# Patient Record
Sex: Female | Born: 1980 | Race: Black or African American | Hispanic: No | Marital: Married | Smoking: Never smoker
Health system: Southern US, Community
[De-identification: ages and names within clinical notes are randomized; demographics above are authoritative.]

## PROBLEM LIST (undated history)

## (undated) ENCOUNTER — Inpatient Hospital Stay (HOSPITAL_COMMUNITY): Payer: Self-pay

## (undated) DIAGNOSIS — Z789 Other specified health status: Secondary | ICD-10-CM

## (undated) HISTORY — PX: TOE SURGERY: SHX1073

---
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
## 2007-11-03 ENCOUNTER — Encounter (INDEPENDENT_AMBULATORY_CARE_PROVIDER_SITE_OTHER): Payer: Self-pay | Admitting: Obstetrics and Gynecology

## 2007-11-03 ENCOUNTER — Inpatient Hospital Stay (HOSPITAL_COMMUNITY): Admission: AD | Admit: 2007-11-03 | Discharge: 2007-11-05 | Payer: Self-pay | Admitting: Obstetrics & Gynecology

## 2007-11-07 ENCOUNTER — Encounter: Admission: RE | Admit: 2007-11-07 | Discharge: 2007-12-07 | Payer: Self-pay | Admitting: Obstetrics and Gynecology

## 2007-12-08 ENCOUNTER — Encounter: Admission: RE | Admit: 2007-12-08 | Discharge: 2008-01-04 | Payer: Self-pay | Admitting: Obstetrics and Gynecology

## 2008-01-05 ENCOUNTER — Encounter: Admission: RE | Admit: 2008-01-05 | Discharge: 2008-01-25 | Payer: Self-pay | Admitting: Obstetrics and Gynecology

## 2011-02-23 NOTE — Op Note (Signed)
NAMEANELA, BENSMAN              ACCOUNT NO.:  [id number]   MEDICAL RECORD NO.:  [id number]          PATIENT TYPE:  INP   LOCATION:  9167                          FACILITY:  [facility]   PHYSICIAN:  Randye Lobo, M.D.   DATE OF BIRTH:  18-Aug-1981   DATE OF PROCEDURE:  11/03/2007  DATE OF DISCHARGE:                               OPERATIVE REPORT   SURGEON:  Randye Lobo, M.D.   PROCEDURE:  Normal spontaneous vaginal delivery with manual extraction  of placenta and endometrial curettage.   ANESTHESIA:  Epidural.   URINE OUTPUT:  75 mL prior to procedure.   ESTIMATED BLOOD LOSS:  400 mL.   COMPLICATIONS:  None.   INDICATIONS FOR PROCEDURE:  The patient is a 30 year old gravida 1, para  0 African American female admitted at 68 +1 weeks' gestation on November 03, 2007, who was noted to have contractions and cervical dilation.  The  patient was admitted in labor after having an uncomplicated prenatal  course.  The patient underwent artificial rupture of membranes for  augmentation of her labor when she was 6-7 cm dilated.  The patient did  receive an epidural for anesthesia.  The patient did have symptomatic  hypotension and she did receive ephedrine 5 mg IV after the epidural had  been placed.  The patient progressed throughout a normal labor course.  The patient then became complete in her dilation and began pushing.   FINDINGS:  A viable female was delivered at 12:30 with Apgars of 9 at one  minute and 9 at five minutes.  The weight was 2275 grams.  There was a  loose nuchal cord x1 which was reduced.  The umbilical cord was noted to  be thin, and the placenta was adherent to the uterine fundus and the  anterior uterine wall.   SPECIMENS:  The umbilical cord and placenta were sent to pathology.   PROCEDURE:  I was called to the bedside due to deep fetal variable  decelerations.  The patient was completely dilated at this time and the  vertex was noted to be at the 2+ station in  the left occiput anterior  position.  The patient's Foley catheter had been previously removed.  The patient was draped and proceeded to have a spontaneous vaginal  delivery of a viable newborn female.  The nuchal cord x1 was reduced.  The  newborn was placed on the maternal abdomen and he was noted to be in  vigorous condition.  The cord was doubly clamped and cut, and the  newborn was carried over to the baby warmer for routine examination and  medications.   The patient was examined was noted to have a second-degree laceration  and bilateral vulvar lacerations.  These were repaired in standard  fashion with 2-0 Vicryl.  The placenta showed some signs of separation  as the patient had two episodes of vaginal bleeding.  Very gentle  traction was applied on the umbilical cord for examination.  The  umbilical cord separated from the placenta with very minimal supportive  traction.  A manual extraction of the  placenta was then performed.  The  placenta was noted to be adherent to the anterior abdominal wall.   At this time, I was concerned that there were retained products of  conception and I did consent to the patient for a curettage of the  endometrium at bedside.  The patient did agree to the procedure after  risks, benefits, and alternatives were reviewed.  The patient had been  receiving penicillin for group B strep prophylaxis throughout her labor.  She had excellent anesthesia from her epidural.  The patient was  sterilely prepped and draped.  The bladder was catheterized of urine.  A  ring forceps was placed on the anterior cervical lip and a banjo curette  was inserted through the cervix and to the level of the uterine fundus  and the endometrium was gently curetted.  A small amount of additional  products of conception were obtained.  Again, manual exploration was  performed and additional small pieces were removed of the placenta.  Final curettage and then final exam  demonstrated no remaining products  of conception.   This concluded the patient's procedure.  She had no significant bleeding  during the curettage.  The patient did receive then Pitocin 20 units IV.   This concluded the patient's procedure.  All needle, sponge, and  instrument counts were correct.   The patient will be treated with antibiotic prophylaxis postpartum.      Randye Lobo, M.D.  Electronically Signed     BES/MEDQ  D:  11/03/2007  T:  11/03/2007  Job:  [id number]

## 2011-07-02 LAB — RPR: RPR Ser Ql: NONREACTIVE

## 2011-07-02 LAB — CBC
HCT: 33 — ABNORMAL LOW
HCT: 38.4
Hemoglobin: 10.7 — ABNORMAL LOW
MCHC: 33.7
MCHC: 34
MCHC: 34.7
MCV: 88.2
Platelets: 168
Platelets: 180
RBC: 4.35
RDW: 13.5
RDW: 13.9

## 2012-04-04 ENCOUNTER — Ambulatory Visit (INDEPENDENT_AMBULATORY_CARE_PROVIDER_SITE_OTHER): Payer: Managed Care, Other (non HMO) | Admitting: Family Medicine

## 2012-04-04 ENCOUNTER — Telehealth: Payer: Self-pay | Admitting: Family Medicine

## 2012-04-04 ENCOUNTER — Telehealth: Payer: Self-pay | Admitting: *Deleted

## 2012-04-04 VITALS — BP 115/68 | HR 75 | Temp 98.6°F | Resp 16 | Ht 68.0 in | Wt 204.6 lb

## 2012-04-04 DIAGNOSIS — Z23 Encounter for immunization: Secondary | ICD-10-CM

## 2012-04-04 DIAGNOSIS — Z9229 Personal history of other drug therapy: Secondary | ICD-10-CM

## 2012-04-04 MED ORDER — HEPATITIS B VAC RECOMBINANT 5 MCG/0.5ML IJ SUSP
0.5000 mL | Freq: Once | INTRAMUSCULAR | Status: AC
  Administered 2012-04-04: 5 ug via INTRAMUSCULAR

## 2012-04-04 MED ORDER — TETANUS-DIPHTH-ACELL PERTUSSIS 5-2.5-18.5 LF-MCG/0.5 IM SUSP
0.5000 mL | Freq: Once | INTRAMUSCULAR | Status: AC
  Administered 2012-04-04: 0.5 mL via INTRAMUSCULAR

## 2012-04-04 NOTE — Telephone Encounter (Signed)
Pt CB and advised to RTC for varicella lab. Pt agreed to come in later today.

## 2012-04-04 NOTE — Progress Notes (Signed)
    Patient Name: Taylor May Date of Birth: [birth year] Medical Record Number: [id number] Gender: female Date of Encounter: 04/04/2012  History of Present Illness:  [name] is a 31 y.o. 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  She needs to have a form completed, but does not wish to have a Pap, BW, etc today.  She is quite healthy and has no medical problems that she knows of.  LMP 03/15/12.  She had a negative PPD in April- she states that she was not told about a 2 step test, so is not sure if she needs this.  For now we will forgo a PPD, but she may certainly return for a PPD if needed.   There is no problem list on file for this patient.  No past medical history on file. No past surgical history on file. History  Substance Use Topics  . Smoking status: Never Smoker   . Smokeless tobacco: Not on file  . Alcohol Use: Not on file   No family history on file. Allergies  Allergen Reactions  . Codeine Hives and Other (See Comments)    hallucinations    Medication list has been reviewed and updated.  Prior to Admission medications   Not on File    Review of Systems:  As per HPI- otherwise negative.   Physical Examination: Filed Vitals:   04/04/12 0801  BP: 115/68  Pulse: 75  Temp: 98.6 F (37 C)  Resp: 16   Filed Vitals:   04/04/12 0801  Height: 5\' 8"  (1.727 m)  Weight: 204 lb 9.6 oz (92.806 kg)   Body mass index is 31.11 kg/(m^2). Ideal Body Weight: Weight in (lb) to have BMI = 25: 164.1   GEN: WDWN, NAD, Non-toxic, A & O x 3 HEENT: Atraumatic, Normocephalic. Neck supple. No masses, No LAD.  TM and oropharynx wnl, PEERL, EOMI Ears and Nose: No external deformity. CV: RRR, No M/G/R. No JVD. No thrill. No extra heart sounds. PULM: CTA B, no wheezes, crackles, rhonchi. No retractions. No resp. distress. No accessory muscle use. ABD: S,  NT, ND, +BS. No rebound. No HSM. EXTR: No c/c/e NEURO Normal gait.  PSYCH: Normally interactive. Conversant. Not depressed or anxious appearing.  Calm demeanor.    Assessment and Plan: 1. Immunizations up to date  Flu vaccine greater than or equal to 3yo preservative free IM, TDaP (BOOSTRIX) injection 0.5 mL, HEP B VACCINE 19 YRS + OLDER, HEP B VACCINE 19 YRS + OLDER   Completed physical exam forms for [facility] today, also started Hep B series, gave TDaP and flu shots, do varicella titer.  We mistakenly sent Delorse out without her blood draw- called her and asked her to please come back and get her varicella titer drawn.   Abbe Amsterdam, MD

## 2012-04-04 NOTE — Telephone Encounter (Signed)
Per Dr. Patsy Lager, called patient told patient to return to office for Varicella Titer test only.  Patient not to be charged a co-pay.  Patient will only be charged for lab and handling fee.  Angie Hawraa Stambaugh, CMA.

## 2012-04-04 NOTE — Telephone Encounter (Signed)
Please get patient to return today to get Varicella titer drawn she left without it being done tried to call her twice even left message on patient voicemail

## 2012-04-05 ENCOUNTER — Encounter: Payer: Self-pay | Admitting: Family Medicine

## 2012-05-12 ENCOUNTER — Encounter: Payer: Self-pay | Admitting: Family Medicine

## 2012-06-13 LAB — OB RESULTS CONSOLE HIV ANTIBODY (ROUTINE TESTING): HIV: NONREACTIVE

## 2012-06-13 LAB — OB RESULTS CONSOLE RPR: RPR: NONREACTIVE

## 2012-06-13 LAB — OB RESULTS CONSOLE GC/CHLAMYDIA
Chlamydia: NEGATIVE
Gonorrhea: NEGATIVE

## 2012-06-13 LAB — OB RESULTS CONSOLE ABO/RH: RH Type: POSITIVE

## 2012-10-11 NOTE — L&D Delivery Note (Signed)
Patient was C/C/+1 and pushed for 8 minutes with epidural.   NSVD  female infant, Apgars 9/9, weight pending.   The patient had a tiny left vaginal wall mucosal tear bleeding so repaired with 3-0 vicryl. Fundus was firm. EBL was expected. Placenta was delivered intact. Vagina was clear.  Baby was vigorous to bedside.  Philip Aspen

## 2012-11-14 ENCOUNTER — Inpatient Hospital Stay (HOSPITAL_COMMUNITY)
Admission: AD | Admit: 2012-11-14 | Discharge: 2012-11-14 | Disposition: A | Discharge status: Home / Self Care | Payer: Managed Care, Other (non HMO) | Source: Ambulatory Visit | Attending: Obstetrics and Gynecology | Admitting: Obstetrics and Gynecology

## 2012-11-14 ENCOUNTER — Encounter (HOSPITAL_COMMUNITY): Payer: Self-pay

## 2012-11-14 DIAGNOSIS — O4700 False labor before 37 completed weeks of gestation, unspecified trimester: Secondary | ICD-10-CM | POA: Insufficient documentation

## 2012-11-14 DIAGNOSIS — O99891 Other specified diseases and conditions complicating pregnancy: Secondary | ICD-10-CM | POA: Insufficient documentation

## 2012-11-14 DIAGNOSIS — R109 Unspecified abdominal pain: Secondary | ICD-10-CM | POA: Insufficient documentation

## 2012-11-14 DIAGNOSIS — O479 False labor, unspecified: Secondary | ICD-10-CM

## 2012-11-14 HISTORY — DX: Other specified health status: Z78.9

## 2012-11-14 LAB — URINALYSIS, ROUTINE W REFLEX MICROSCOPIC
Bilirubin Urine: NEGATIVE
Glucose, UA: NEGATIVE mg/dL
Ketones, ur: 15 mg/dL — AB
Protein, ur: NEGATIVE mg/dL
Urobilinogen, UA: 1 mg/dL (ref 0.0–1.0)

## 2012-11-14 LAB — URINE MICROSCOPIC-ADD ON

## 2012-11-14 MED ORDER — NIFEDIPINE 10 MG PO CAPS
10.0000 mg | ORAL_CAPSULE | Freq: Once | ORAL | Status: AC
  Administered 2012-11-14: 10 mg via ORAL
  Filled 2012-11-14: qty 1

## 2012-11-14 NOTE — Progress Notes (Signed)
Written and verbal d/c instructions given and understanding voiced. 

## 2012-11-14 NOTE — MAU Provider Note (Signed)
History     CSN: [id number]  Arrival date and time: 11/14/12 1238   None     Chief Complaint  Patient presents with  . Abdominal Pain   HPI Taylor May is a 32 y.o. female who presents to MAU with abdominal pain. The pain is located across the lower abdomen.  The pain started today approximately 9 am.  She describes the pain as cramping, pressure.  She rates the pain as 7/10. The history was provided by the patient.  OB History    Grav Para Term Preterm Abortions TAB SAB Ect Mult Living   2 1 1  0 0 0 0 0 0 1      Past Medical History  Diagnosis Date  . No pertinent past medical history     Past Surgical History  Procedure Date  . Ganglion cyst excision 1999    R wrist    Family History  Problem Relation Age of Onset  . Other Neg Hx     History  Substance Use Topics  . Smoking status: Never Smoker   . Smokeless tobacco: Not on file  . Alcohol Use: No    Allergies:  Allergies  Allergen Reactions  . Codeine Hives and Other (See Comments)    hallucinations    Prescriptions prior to admission  Medication Sig Dispense Refill  . calcium carbonate (OS-CAL) 1250 MG chewable tablet Chew 2 tablets by mouth daily.      Marland Kitchen dextromethorphan-guaiFENesin (MUCINEX DM) 30-600 MG per 12 hr tablet Take 1 tablet by mouth every 12 (twelve) hours.      . Dextromethorphan-Guaifenesin 5-100 MG/5ML LIQD Take 10 mLs by mouth.      . Prenatal Vit-Fe Fumarate-FA (PRENATAL MULTIVITAMIN) TABS Take 1 tablet by mouth daily.        ROS Blood pressure 125/68, pulse 82, temperature 97.1 F (36.2 C), temperature source Oral, resp. rate 20, height 5\' 10"  (1.778 m), weight 217 lb (98.431 kg), last menstrual period 03/15/2012.  Physical Exam  Nursing note and vitals reviewed. Constitutional: She is oriented to person, place, and time. She appears well-developed and well-nourished. No distress.  HENT:  Head: Normocephalic and atraumatic.  Eyes: EOM are normal.  Neck: Neck supple.   Cardiovascular: Normal rate.   Respiratory: Effort normal. No respiratory distress.  GI:       Gravid consistent with dates  Genitourinary:       Dilation: 1 Effacement (%): 60 Cervical Position: Posterior Station: -2 Exam by:: Quintella Baton RNC   Musculoskeletal: Normal range of motion.  Neurological: She is alert and oriented to person, place, and time.  Skin: Skin is warm and dry.  Psychiatric: She has a normal mood and affect. Her behavior is normal. Judgment and thought content normal.   Results for orders placed during the hospital encounter of 11/14/12 (from the past 24 hour(s))  URINALYSIS, ROUTINE W REFLEX MICROSCOPIC     Status: Abnormal   Collection Time   11/14/12 12:50 PM      Component Value Range   Color, Urine YELLOW  YELLOW   APPearance CLEAR  CLEAR   Specific Gravity, Urine 1.010  1.005 - 1.030   pH 6.5  5.0 - 8.0   Glucose, UA NEGATIVE  NEGATIVE mg/dL   Hgb urine dipstick TRACE (*) NEGATIVE   Bilirubin Urine NEGATIVE  NEGATIVE   Ketones, ur 15 (*) NEGATIVE mg/dL   Protein, ur NEGATIVE  NEGATIVE mg/dL   Urobilinogen, UA 1.0  0.0 - 1.0 mg/dL  Nitrite NEGATIVE  NEGATIVE   Leukocytes, UA NEGATIVE  NEGATIVE  URINE MICROSCOPIC-ADD ON     Status: Abnormal   Collection Time   11/14/12 12:50 PM      Component Value Range   Squamous Epithelial / LPF RARE  RARE   WBC, UA 0-2  <3 WBC/hpf   RBC / HPF 3-6  <3 RBC/hpf   Bacteria, UA FEW (*) RARE    EFM: baseline 135, reactive tracing contracting every 3 to 4 minutes Procedures Laura-Lee Villegas, RN, FNP, [facility] 11/14/2012, 1:46 PM  14:30 Discussed clinical and lab findings with Dr. Henderson Cloud. Will give Procardia   Assessment: 32 y.o. female @ [redacted]w[redacted]d gestation with abdominal pressure   False labor  Plan:  Procardia 10 mg PO   PO fluids, pelvic rest, follow up in the office Discussed with the patient and all questioned fully answered.    Medication List     As of 11/14/2012  3:15 PM    CONTINUE taking these medications          calcium carbonate 1250 MG chewable tablet   Commonly known as: OS-CAL      * dextromethorphan-guaiFENesin 30-600 MG per 12 hr tablet   Commonly known as: MUCINEX DM      * Dextromethorphan-Guaifenesin 5-100 MG/5ML Liqd      prenatal multivitamin Tabs     * Notice: This list has 2 medication(s) that are the same as other medications prescribed for you. Read the directions carefully, and ask your doctor or other care provider to review them with you.

## 2012-11-14 NOTE — Progress Notes (Signed)
Kerrie Buffalo NP in to see pt

## 2012-11-17 LAB — OB RESULTS CONSOLE GBS: GBS: NEGATIVE

## 2012-12-14 ENCOUNTER — Encounter (HOSPITAL_COMMUNITY): Payer: Self-pay | Admitting: *Deleted

## 2012-12-14 ENCOUNTER — Inpatient Hospital Stay (HOSPITAL_COMMUNITY)
Admission: AD | Admit: 2012-12-14 | Discharge: 2012-12-17 | DRG: 775 | Disposition: A | Discharge status: Home / Self Care | Payer: Managed Care, Other (non HMO) | Source: Ambulatory Visit | Attending: Obstetrics and Gynecology | Admitting: Obstetrics and Gynecology

## 2012-12-14 NOTE — MAU Note (Signed)
Pt states she has been contracting every 5 min

## 2012-12-15 ENCOUNTER — Encounter (HOSPITAL_COMMUNITY): Payer: Self-pay | Admitting: Anesthesiology

## 2012-12-15 ENCOUNTER — Encounter (HOSPITAL_COMMUNITY): Payer: Self-pay | Admitting: *Deleted

## 2012-12-15 ENCOUNTER — Inpatient Hospital Stay (HOSPITAL_COMMUNITY): Payer: Managed Care, Other (non HMO) | Admitting: Anesthesiology

## 2012-12-15 LAB — CBC
MCHC: 32.7 g/dL (ref 30.0–36.0)
MCV: 82.1 fL (ref 78.0–100.0)
Platelets: 147 10*3/uL — ABNORMAL LOW (ref 150–400)
RDW: 13.8 % (ref 11.5–15.5)
WBC: 5.7 10*3/uL (ref 4.0–10.5)

## 2012-12-15 LAB — ABO/RH: ABO/RH(D): O POS

## 2012-12-15 MED ORDER — EPHEDRINE 5 MG/ML INJ
10.0000 mg | INTRAVENOUS | Status: DC | PRN
  Filled 2012-12-15: qty 4

## 2012-12-15 MED ORDER — SIMETHICONE 80 MG PO CHEW: 80.0000 mg | CHEWABLE_TABLET | ORAL | Status: DC | PRN

## 2012-12-15 MED ORDER — DIPHENHYDRAMINE HCL 25 MG PO CAPS: 25.0000 mg | ORAL_CAPSULE | Freq: Four times a day (QID) | ORAL | Status: DC | PRN

## 2012-12-15 MED ORDER — OXYTOCIN 40 UNITS IN LACTATED RINGERS INFUSION - SIMPLE MED
1.0000 m[IU]/min | INTRAVENOUS | Status: DC
  Administered 2012-12-15: 2 m[IU]/min via INTRAVENOUS

## 2012-12-15 MED ORDER — WITCH HAZEL-GLYCERIN EX PADS: 1.0000 "application " | MEDICATED_PAD | CUTANEOUS | Status: DC | PRN

## 2012-12-15 MED ORDER — ONDANSETRON HCL 4 MG PO TABS: 4.0000 mg | ORAL_TABLET | ORAL | Status: DC | PRN

## 2012-12-15 MED ORDER — DIBUCAINE 1 % RE OINT: 1.0000 "application " | TOPICAL_OINTMENT | RECTAL | Status: DC | PRN

## 2012-12-15 MED ORDER — OXYTOCIN 40 UNITS IN LACTATED RINGERS INFUSION - SIMPLE MED
62.5000 mL/h | INTRAVENOUS | Status: DC
  Administered 2012-12-15: 62.5 mL/h via INTRAVENOUS
  Filled 2012-12-15: qty 1000

## 2012-12-15 MED ORDER — OXYTOCIN BOLUS FROM INFUSION: 500.0000 mL | INTRAVENOUS | Status: DC

## 2012-12-15 MED ORDER — LACTATED RINGERS IV SOLN: 500.0000 mL | Freq: Once | INTRAVENOUS | Status: DC

## 2012-12-15 MED ORDER — IBUPROFEN 600 MG PO TABS: 600.0000 mg | ORAL_TABLET | Freq: Four times a day (QID) | ORAL | Status: DC | PRN

## 2012-12-15 MED ORDER — LACTATED RINGERS IV SOLN: 500.0000 mL | INTRAVENOUS | Status: DC | PRN

## 2012-12-15 MED ORDER — ONDANSETRON HCL 4 MG/2ML IJ SOLN: 4.0000 mg | INTRAMUSCULAR | Status: DC | PRN

## 2012-12-15 MED ORDER — LIDOCAINE HCL (PF) 1 % IJ SOLN
30.0000 mL | INTRAMUSCULAR | Status: DC | PRN
  Filled 2012-12-15: qty 30

## 2012-12-15 MED ORDER — SENNOSIDES-DOCUSATE SODIUM 8.6-50 MG PO TABS
2.0000 | ORAL_TABLET | Freq: Every day | ORAL | Status: DC
  Administered 2012-12-15 – 2012-12-16 (×2): 2 via ORAL

## 2012-12-15 MED ORDER — ONDANSETRON HCL 4 MG/2ML IJ SOLN
4.0000 mg | Freq: Four times a day (QID) | INTRAMUSCULAR | Status: DC | PRN
  Administered 2012-12-15: 4 mg via INTRAVENOUS
  Filled 2012-12-15: qty 2

## 2012-12-15 MED ORDER — EPHEDRINE 5 MG/ML INJ: 10.0000 mg | INTRAVENOUS | Status: DC | PRN

## 2012-12-15 MED ORDER — LACTATED RINGERS IV SOLN
INTRAVENOUS | Status: DC
  Administered 2012-12-15 (×2): via INTRAVENOUS

## 2012-12-15 MED ORDER — LANOLIN HYDROUS EX OINT: TOPICAL_OINTMENT | CUTANEOUS | Status: DC | PRN

## 2012-12-15 MED ORDER — PHENYLEPHRINE 40 MCG/ML (10ML) SYRINGE FOR IV PUSH (FOR BLOOD PRESSURE SUPPORT)
80.0000 ug | PREFILLED_SYRINGE | INTRAVENOUS | Status: DC | PRN
  Filled 2012-12-15: qty 5

## 2012-12-15 MED ORDER — ZOLPIDEM TARTRATE 5 MG PO TABS: 5.0000 mg | ORAL_TABLET | Freq: Every evening | ORAL | Status: DC | PRN

## 2012-12-15 MED ORDER — TETANUS-DIPHTH-ACELL PERTUSSIS 5-2.5-18.5 LF-MCG/0.5 IM SUSP: 0.5000 mL | Freq: Once | INTRAMUSCULAR | Status: DC

## 2012-12-15 MED ORDER — NALBUPHINE SYRINGE 5 MG/0.5 ML: 5.0000 mg | INJECTION | INTRAMUSCULAR | Status: DC | PRN

## 2012-12-15 MED ORDER — FLEET ENEMA 7-19 GM/118ML RE ENEM: 1.0000 | ENEMA | RECTAL | Status: DC | PRN

## 2012-12-15 MED ORDER — PHENYLEPHRINE 40 MCG/ML (10ML) SYRINGE FOR IV PUSH (FOR BLOOD PRESSURE SUPPORT): 80.0000 ug | PREFILLED_SYRINGE | INTRAVENOUS | Status: DC | PRN

## 2012-12-15 MED ORDER — CITRIC ACID-SODIUM CITRATE 334-500 MG/5ML PO SOLN: 30.0000 mL | ORAL | Status: DC | PRN

## 2012-12-15 MED ORDER — PRENATAL MULTIVITAMIN CH
1.0000 | ORAL_TABLET | Freq: Every day | ORAL | Status: DC
  Administered 2012-12-16 – 2012-12-17 (×2): 1 via ORAL
  Filled 2012-12-15 (×2): qty 1

## 2012-12-15 MED ORDER — OXYCODONE-ACETAMINOPHEN 5-325 MG PO TABS: 1.0000 | ORAL_TABLET | ORAL | Status: DC | PRN

## 2012-12-15 MED ORDER — IBUPROFEN 600 MG PO TABS
600.0000 mg | ORAL_TABLET | Freq: Four times a day (QID) | ORAL | Status: DC
  Administered 2012-12-15 – 2012-12-17 (×7): 600 mg via ORAL
  Filled 2012-12-15 (×8): qty 1

## 2012-12-15 MED ORDER — FENTANYL 2.5 MCG/ML BUPIVACAINE 1/10 % EPIDURAL INFUSION (WH - ANES)
14.0000 mL/h | INTRAMUSCULAR | Status: DC
  Administered 2012-12-15: 16 mL/h via EPIDURAL
  Administered 2012-12-15: 14 mL/h via EPIDURAL
  Filled 2012-12-15 (×2): qty 125

## 2012-12-15 MED ORDER — LIDOCAINE HCL (PF) 1 % IJ SOLN
INTRAMUSCULAR | Status: DC | PRN
  Administered 2012-12-15 (×4): 4 mL

## 2012-12-15 MED ORDER — TERBUTALINE SULFATE 1 MG/ML IJ SOLN: 0.2500 mg | Freq: Once | INTRAMUSCULAR | Status: DC | PRN

## 2012-12-15 MED ORDER — ACETAMINOPHEN 325 MG PO TABS: 650.0000 mg | ORAL_TABLET | ORAL | Status: DC | PRN

## 2012-12-15 MED ORDER — DIPHENHYDRAMINE HCL 50 MG/ML IJ SOLN: 12.5000 mg | INTRAMUSCULAR | Status: DC | PRN

## 2012-12-15 MED ORDER — BENZOCAINE-MENTHOL 20-0.5 % EX AERO
1.0000 "application " | INHALATION_SPRAY | CUTANEOUS | Status: DC | PRN
  Administered 2012-12-15: 1 via TOPICAL
  Filled 2012-12-15: qty 56

## 2012-12-15 NOTE — Anesthesia Preprocedure Evaluation (Signed)
Anesthesia Evaluation  Patient identified by MRN, date of birth, ID band Patient awake    Reviewed: Allergy & Precautions, H&P , NPO status , Patient's Chart, lab work & pertinent test results, reviewed documented beta blocker date and time   History of Anesthesia Complications Negative for: history of anesthetic complications  Airway Mallampati: II TM Distance: >3 FB Neck ROM: full    Dental  (+) Teeth Intact   Pulmonary neg pulmonary ROS,  breath sounds clear to auscultation        Cardiovascular negative cardio ROS  Rhythm:regular Rate:Normal     Neuro/Psych negative neurological ROS  negative psych ROS   GI/Hepatic negative GI ROS, Neg liver ROS,   Endo/Other  negative endocrine ROS  Renal/GU negative Renal ROS     Musculoskeletal   Abdominal   Peds  Hematology  (+) anemia ,   Anesthesia Other Findings   Reproductive/Obstetrics (+) Pregnancy                           Anesthesia Physical Anesthesia Plan  ASA: II  Anesthesia Plan: Epidural   Post-op Pain Management:    Induction:   Airway Management Planned:   Additional Equipment:   Intra-op Plan:   Post-operative Plan:   Informed Consent: I have reviewed the patients History and Physical, chart, labs and discussed the procedure including the risks, benefits and alternatives for the proposed anesthesia with the patient or authorized representative who has indicated his/her understanding and acceptance.     Plan Discussed with:   Anesthesia Plan Comments:         Anesthesia Quick Evaluation  

## 2012-12-15 NOTE — H&P (Signed)
32 y.o. 105w1d  G2P1001 comes in c/o labor.  Otherwise has good fetal movement and no bleeding.  Past Medical History  Diagnosis Date  . No pertinent past medical history     Past Surgical History  Procedure Laterality Date  . Ganglion cyst excision  1999    R wrist    OB History   Grav Para Term Preterm Abortions TAB SAB Ect Mult Living   2 1 1  0 0 0 0 0 0 1     # Outc Date GA Lbr Len/2nd Wgt Sex Del Anes PTL Lv   1 TRM            2 CUR               History   Social History  . Marital Status: Married    Spouse Name: N/A    Number of Children: N/A  . Years of Education: N/A   Occupational History  . Not on file.   Social History Main Topics  . Smoking status: Never Smoker   . Smokeless tobacco: Not on file  . Alcohol Use: No  . Drug Use: No  . Sexually Active: Yes   Other Topics Concern  . Not on file   Social History Narrative  . No narrative on file   Codeine    Prenatal Transfer Tool  Maternal Diabetes: No Genetic Screening: Normal Maternal Ultrasounds/Referrals: Normal Fetal Ultrasounds or other Referrals:  None Maternal Substance Abuse:  No Significant Maternal Medications:  None Significant Maternal Lab Results: None  Other XLK:GMWN.    Filed Vitals:   12/15/12 0434  BP: 122/64  Pulse: 97  Temp: 98 F (36.7 C)  Resp: 18     Lungs/Cor:  NAD Abdomen:  soft, gravid Ex:  no cords, erythema SVE:  6/C/-2 FHTs:  130s, good STV, NST R Toco:  q3-4   A/P   Term labor.  GBS neg.  HORVATH,MICHELLE A

## 2012-12-15 NOTE — Anesthesia Procedure Notes (Signed)
Epidural Patient location during procedure: OB Start time: 12/15/2012 6:17 AM  Staffing Performed by: anesthesiologist   Preanesthetic Checklist Completed: patient identified, site marked, surgical consent, pre-op evaluation, timeout performed, IV checked, risks and benefits discussed and monitors and equipment checked  Epidural Patient position: sitting Prep: site prepped and draped and DuraPrep Patient monitoring: continuous pulse ox and blood pressure Approach: midline Injection technique: LOR air  Needle:  Needle type: Tuohy  Needle gauge: 17 G Needle length: 9 cm and 9 Needle insertion depth: 5.5 cm Catheter type: closed end flexible Catheter size: 19 Gauge Catheter at skin depth: 10.5 cm Test dose: negative  Assessment Events: blood not aspirated, injection not painful, no injection resistance, negative IV test and no paresthesia  Additional Notes Discussed risk of headache, infection, bleeding, nerve injury and failed or incomplete block.  Patient voices understanding and wishes to proceed.  Epidural placed easily on first attempt.  No paresthesia.  Patient tolerated procedure well with no apparent complications.  Jasmine December, MD Reason for block:procedure for pain

## 2012-12-16 LAB — CBC
HCT: 27.9 % — ABNORMAL LOW (ref 36.0–46.0)
Hemoglobin: 9.1 g/dL — ABNORMAL LOW (ref 12.0–15.0)
MCH: 27 pg (ref 26.0–34.0)
MCHC: 32.6 g/dL (ref 30.0–36.0)
MCV: 82.8 fL (ref 78.0–100.0)

## 2012-12-16 NOTE — Progress Notes (Signed)
Patient is eating, ambulating, voiding.  Pain control is good.  Appropriate lochia, no complaints.  No HA, vision change, RUQ pain.  Filed Vitals:   12/15/12 1540 12/15/12 1657 12/15/12 2114 12/16/12 0530  BP: 126/75 152/89 117/73 119/74  Pulse: 80 92 74 66  Temp: 97.8 F (36.6 C) 99.1 F (37.3 C) 98.7 F (37.1 C) 98 F (36.7 C)  TempSrc: Oral Oral Oral Oral  Resp: 22 18 18 18   Height:      Weight:      SpO2:        Fundus firm Perineum without swelling.  Lab Results  Component Value Date   WBC 6.3 12/16/2012   HGB 9.1* 12/16/2012   HCT 27.9* 12/16/2012   MCV 82.8 12/16/2012   PLT 131* 12/16/2012    --/--/O POS (03/07 0505)  A/P Post partum day 1. Some labile BPs, a few mild range.  Asymptomatic, monitor.  Routine care.     Philip Aspen

## 2012-12-16 NOTE — Anesthesia Postprocedure Evaluation (Signed)
  Anesthesia Post-op Note  Patient: Taylor May  Procedure(s) Performed: * No procedures listed *  Patient Location: PACU and Mother/Baby  Anesthesia Type:Epidural  Level of Consciousness: awake, alert  and oriented  Airway and Oxygen Therapy: Patient Spontanous Breathing  Post-op Pain: mild  Post-op Assessment: Patient's Cardiovascular Status Stable, Respiratory Function Stable and No signs of Nausea or vomiting  Post-op Vital Signs: stable  Complications: No apparent anesthesia complications

## 2012-12-17 NOTE — Discharge Summary (Signed)
Obstetric Discharge Summary Reason for Admission: onset of labor Prenatal Procedures: none Intrapartum Procedures: spontaneous vaginal delivery Postpartum Procedures: none Complications-Operative and Postpartum: vaginal laceration Hemoglobin  Date Value Range Status  12/16/2012 9.1* 12.0 - 15.0 g/dL Final     HCT  Date Value Range Status  12/16/2012 27.9* 36.0 - 46.0 % Final    Physical Exam:  General: alert and cooperative Lochia: appropriate Uterine Fundus: firm DVT Evaluation: No evidence of DVT seen on physical exam.  Discharge Diagnoses: Term Pregnancy-delivered  Discharge Information: Date: 12/17/2012 Activity: pelvic rest Diet: routine Medications: PNV and Ibuprofen Condition: stable Instructions: refer to practice specific booklet Discharge to: home Follow-up Information   Follow up with CALLAHAN, SIDNEY, DO In 4 weeks.   Contact information:   [address] [place] [address] [phone]       Newborn Data: Live born female  Birth Weight: 8 lb 2 oz (3685 g) APGAR: 9, 9  Home with mother.  Taylor May 12/17/2012, 9:47 AM

## 2012-12-27 ENCOUNTER — Telehealth (HOSPITAL_COMMUNITY): Payer: Self-pay | Admitting: *Deleted

## 2012-12-27 NOTE — Telephone Encounter (Signed)
Resolve episode 

## 2013-01-15 ENCOUNTER — Other Ambulatory Visit: Payer: Self-pay

## 2014-01-01 ENCOUNTER — Other Ambulatory Visit: Payer: Self-pay | Admitting: Family

## 2014-01-01 DIAGNOSIS — R42 Dizziness and giddiness: Secondary | ICD-10-CM

## 2014-01-04 ENCOUNTER — Ambulatory Visit
Admission: RE | Admit: 2014-01-04 | Discharge: 2014-01-04 | Disposition: A | Discharge status: Home / Self Care | Payer: Private Health Insurance - Indemnity | Source: Ambulatory Visit | Attending: Family | Admitting: Family

## 2014-01-04 DIAGNOSIS — R42 Dizziness and giddiness: Secondary | ICD-10-CM

## 2014-08-12 ENCOUNTER — Encounter (HOSPITAL_COMMUNITY): Payer: Self-pay | Admitting: *Deleted

## 2015-02-12 IMAGING — CT CT HEAD W/O CM
2 series · 16 of 30 positions shown, 20 images · non-contrast
Comparison: None.

CLINICAL DATA: Occipital headache

EXAM:
CT HEAD WITHOUT CONTRAST
TECHNIQUE: Contiguous axial images were obtained from the base of the skull
through the vertex without intravenous contrast.

[Series 3: head bone · axial · 0.49mm/px · z∈[+10,+50]mm · 3 of 28 slices shown]
[im 2/28  bone]
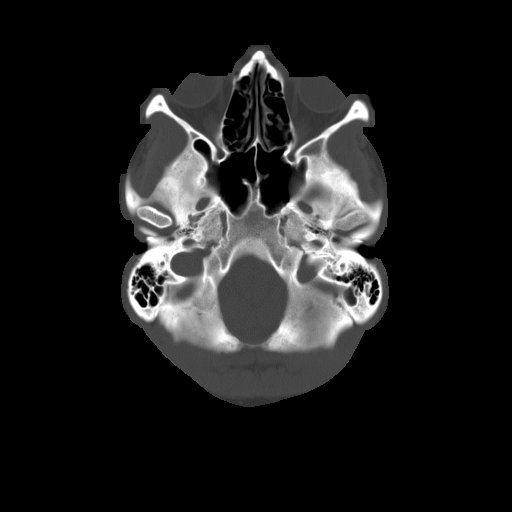
[im 6/28  bone]
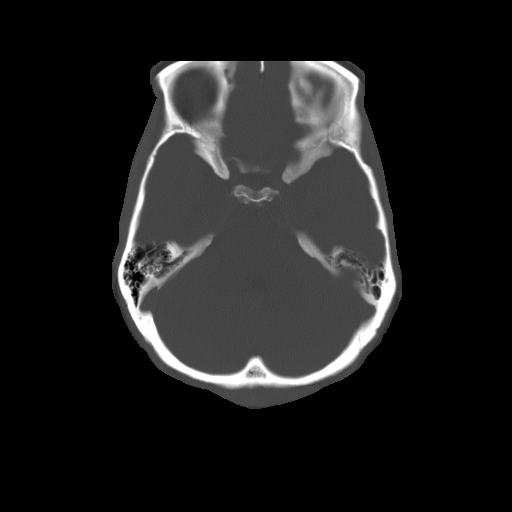
[im 10/28  bone]
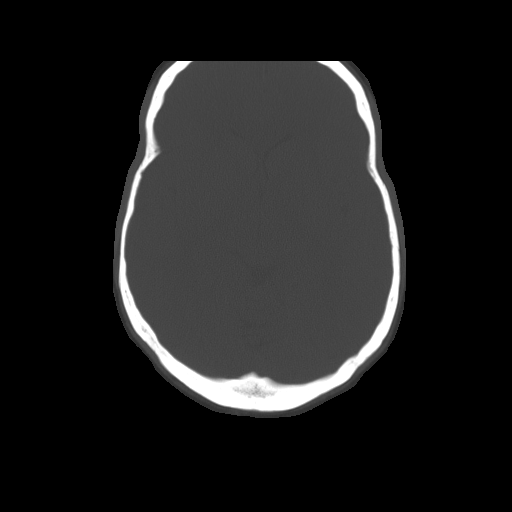

[Series 32: 3d filtered head w/o · axial · non-contrast · 0.49mm/px · z∈[+10,+130]mm · 13 of 28 slices shown, 17 images]
[im 2/28  brain]
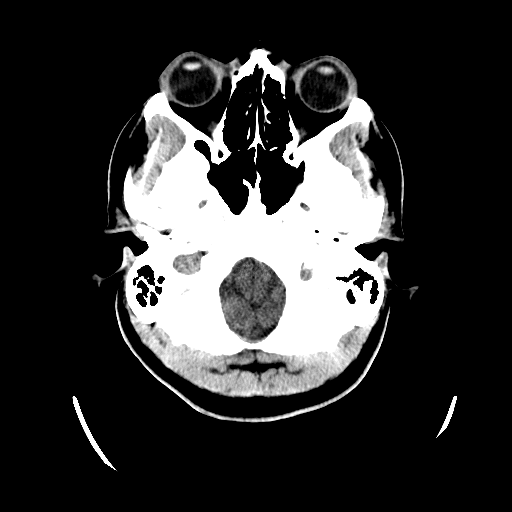
[im 2/28  bone]
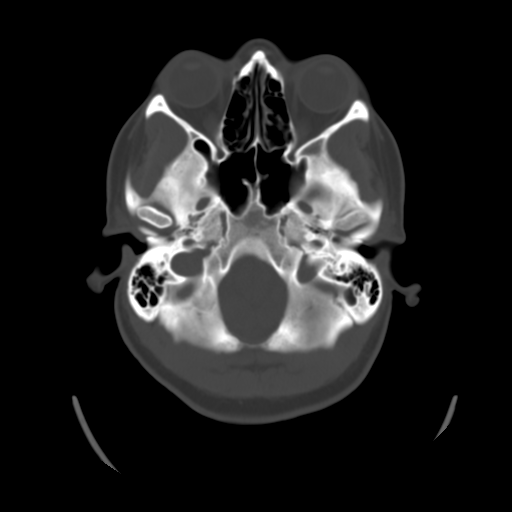
[im 4/28  brain]
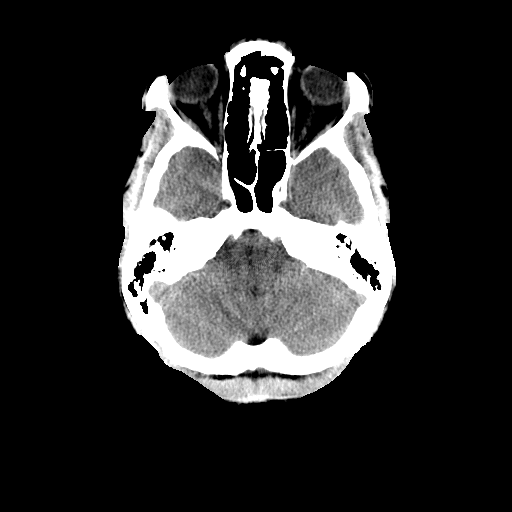
[im 6/28  brain]
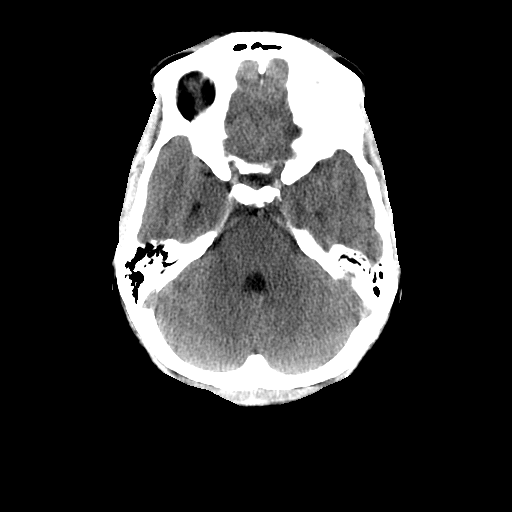
[im 8/28  brain]
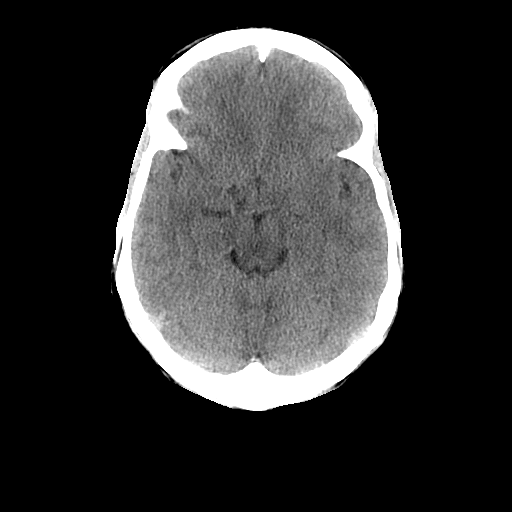
[im 10/28  brain]
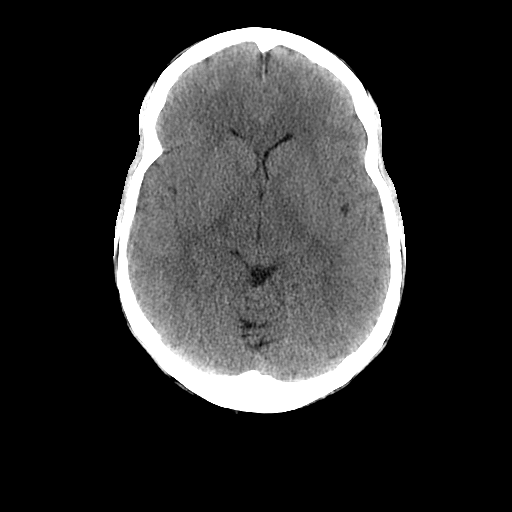
[im 10/28  bone]
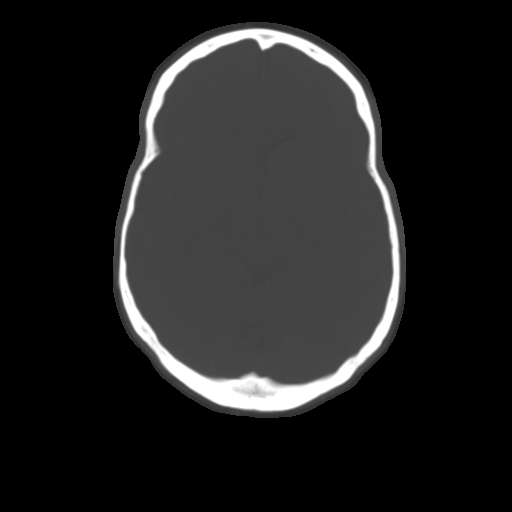
[im 12/28  brain]
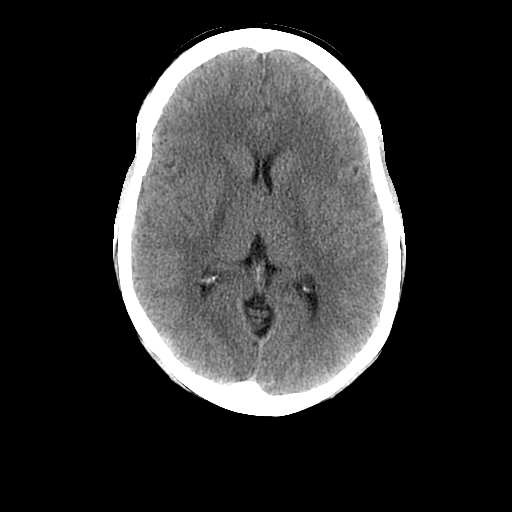
[im 14/28  brain]
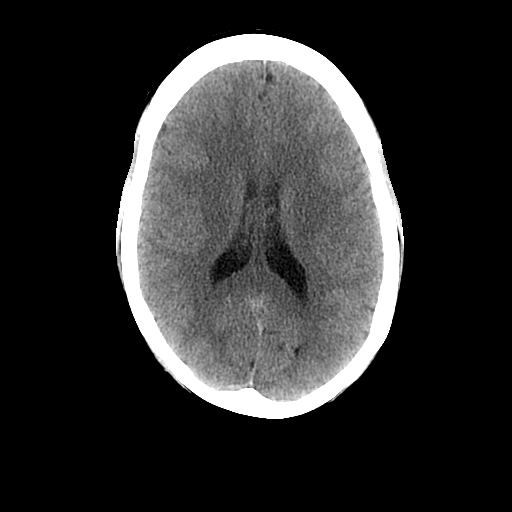
[im 16/28  brain]
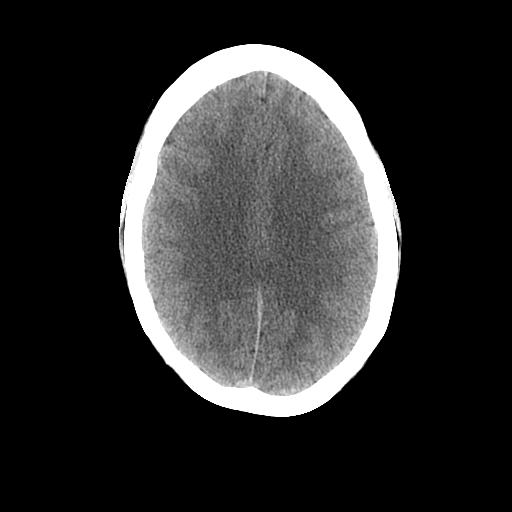
[im 18/28  brain]
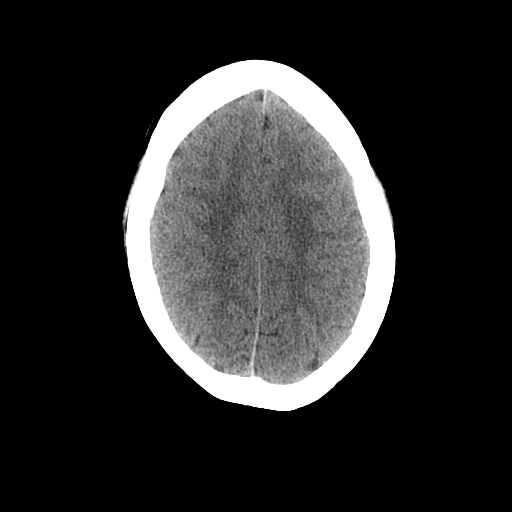
[im 18/28  bone]
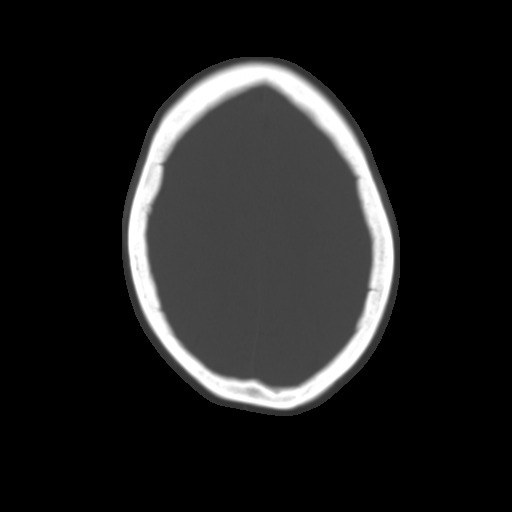
[im 20/28  brain]
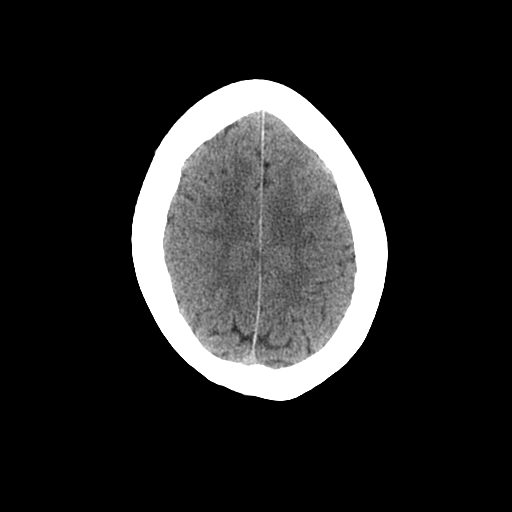
[im 22/28  brain]
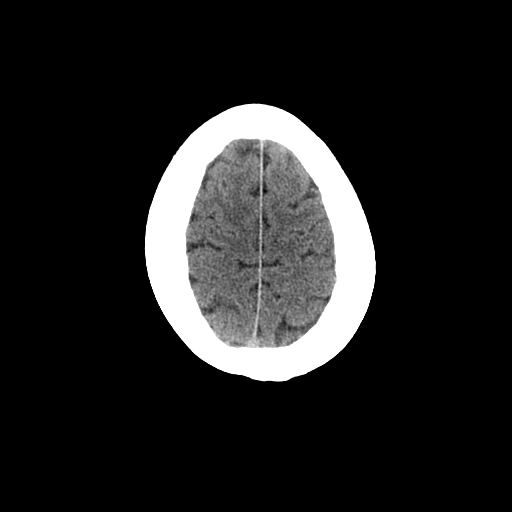
[im 24/28  brain]
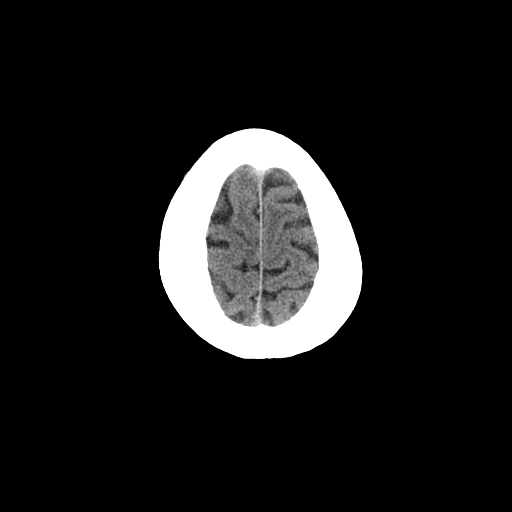
[im 26/28  brain]
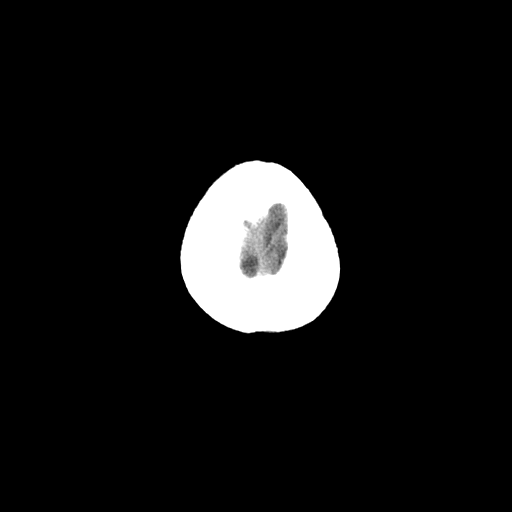
[im 26/28  bone]
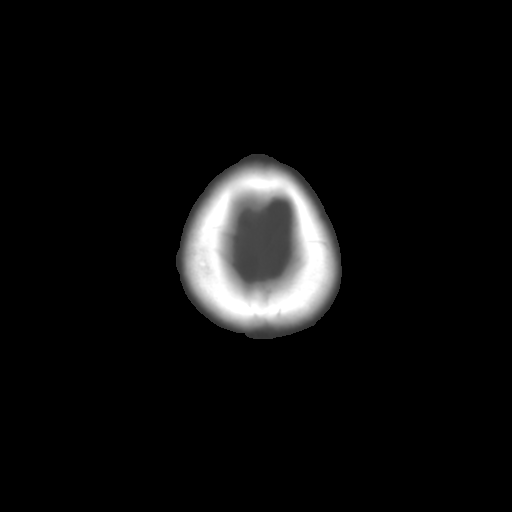

[16 of 30 positions shown; findings below may reference images not displayed]

FINDINGS: There is no evidence of mass effect, midline shift or extra-axial
fluid collections. There is no evidence of a space-occupying lesion
or intracranial hemorrhage. There is no evidence of a cortical-based
area of acute infarction.

The ventricles and sulci are appropriate for the patient's age. The
basal cisterns are patent.

Visualized portions of the orbits are unremarkable. The visualized
portions of the paranasal sinuses and mastoid air cells are
unremarkable.

The osseous structures are unremarkable.
IMPRESSION: Normal CT of the brain without intravenous contrast.

## 2016-01-28 DIAGNOSIS — M7651 Patellar tendinitis, right knee: Secondary | ICD-10-CM | POA: Diagnosis not present

## 2016-01-28 DIAGNOSIS — M7652 Patellar tendinitis, left knee: Secondary | ICD-10-CM | POA: Diagnosis not present

## 2016-04-28 DIAGNOSIS — D6489 Other specified anemias: Secondary | ICD-10-CM | POA: Diagnosis not present

## 2016-04-28 DIAGNOSIS — R5383 Other fatigue: Secondary | ICD-10-CM | POA: Diagnosis not present

## 2016-04-28 DIAGNOSIS — R55 Syncope and collapse: Secondary | ICD-10-CM | POA: Diagnosis not present

## 2016-04-28 DIAGNOSIS — L639 Alopecia areata, unspecified: Secondary | ICD-10-CM | POA: Diagnosis not present

## 2016-05-20 DIAGNOSIS — L639 Alopecia areata, unspecified: Secondary | ICD-10-CM | POA: Diagnosis not present

## 2016-05-20 DIAGNOSIS — K429 Umbilical hernia without obstruction or gangrene: Secondary | ICD-10-CM | POA: Diagnosis not present

## 2016-05-20 DIAGNOSIS — Z0000 Encounter for general adult medical examination without abnormal findings: Secondary | ICD-10-CM | POA: Diagnosis not present

## 2016-06-07 DIAGNOSIS — K429 Umbilical hernia without obstruction or gangrene: Secondary | ICD-10-CM | POA: Diagnosis not present

## 2016-07-01 DIAGNOSIS — Z01419 Encounter for gynecological examination (general) (routine) without abnormal findings: Secondary | ICD-10-CM | POA: Diagnosis not present

## 2016-07-01 DIAGNOSIS — Z6830 Body mass index (BMI) 30.0-30.9, adult: Secondary | ICD-10-CM | POA: Diagnosis not present

## 2016-10-16 ENCOUNTER — Emergency Department (HOSPITAL_COMMUNITY)
Admission: EM | Admit: 2016-10-16 | Discharge: 2016-10-16 | Disposition: A | Discharge status: Home / Self Care | Payer: BLUE CROSS/BLUE SHIELD | Attending: Emergency Medicine | Admitting: Emergency Medicine

## 2016-10-16 ENCOUNTER — Emergency Department (HOSPITAL_COMMUNITY): Payer: BLUE CROSS/BLUE SHIELD

## 2016-10-16 ENCOUNTER — Encounter (HOSPITAL_COMMUNITY): Payer: Self-pay

## 2016-10-16 DIAGNOSIS — J40 Bronchitis, not specified as acute or chronic: Secondary | ICD-10-CM | POA: Insufficient documentation

## 2016-10-16 DIAGNOSIS — R079 Chest pain, unspecified: Secondary | ICD-10-CM | POA: Diagnosis not present

## 2016-10-16 DIAGNOSIS — R05 Cough: Secondary | ICD-10-CM | POA: Diagnosis not present

## 2016-10-16 LAB — CBC
HEMATOCRIT: 36.8 % (ref 36.0–46.0)
Hemoglobin: 12.2 g/dL (ref 12.0–15.0)
MCH: 26.1 pg (ref 26.0–34.0)
MCHC: 33.2 g/dL (ref 30.0–36.0)
MCV: 78.6 fL (ref 78.0–100.0)
PLATELETS: 304 10*3/uL (ref 150–400)
RBC: 4.68 MIL/uL (ref 3.87–5.11)
RDW: 14.9 % (ref 11.5–15.5)
WBC: 9.3 10*3/uL (ref 4.0–10.5)

## 2016-10-16 LAB — BASIC METABOLIC PANEL
ANION GAP: 10 (ref 5–15)
BUN: 14 mg/dL (ref 6–20)
CALCIUM: 8.7 mg/dL — AB (ref 8.9–10.3)
CO2: 24 mmol/L (ref 22–32)
Chloride: 104 mmol/L (ref 101–111)
Creatinine, Ser: 0.87 mg/dL (ref 0.44–1.00)
GFR calc Af Amer: >60 mL/min (ref >60)
GFR calc non Af Amer: >60 mL/min (ref >60)
GLUCOSE: 93 mg/dL (ref 65–99)
Potassium: 3.3 mmol/L — ABNORMAL LOW (ref 3.5–5.1)
Sodium: 138 mmol/L (ref 135–145)

## 2016-10-16 LAB — URINALYSIS, ROUTINE W REFLEX MICROSCOPIC
Bacteria, UA: NONE SEEN
Bilirubin Urine: NEGATIVE
GLUCOSE, UA: NEGATIVE mg/dL
HGB URINE DIPSTICK: NEGATIVE
KETONES UR: NEGATIVE mg/dL
Leukocytes, UA: NEGATIVE
NITRITE: NEGATIVE
PROTEIN: 30 mg/dL — AB
Specific Gravity, Urine: 1.032 — ABNORMAL HIGH (ref 1.005–1.030)
pH: 5 (ref 5.0–8.0)

## 2016-10-16 LAB — I-STAT TROPONIN, ED: Troponin i, poc: 0 ng/mL (ref 0.00–0.08)

## 2016-10-16 LAB — D-DIMER, QUANTITATIVE (NOT AT ARMC): D DIMER QUANT: 0.29 ug{FEU}/mL (ref 0.00–0.50)

## 2016-10-16 MED ORDER — AEROCHAMBER PLUS W/MASK MISC
Status: AC
  Administered 2016-10-16: 1
  Filled 2016-10-16: qty 1

## 2016-10-16 MED ORDER — AZITHROMYCIN 250 MG PO TABS
500.0000 mg | ORAL_TABLET | Freq: Once | ORAL | Status: AC
  Administered 2016-10-16: 500 mg via ORAL
  Filled 2016-10-16: qty 2

## 2016-10-16 MED ORDER — PREDNISONE 20 MG PO TABS
40.0000 mg | ORAL_TABLET | Freq: Once | ORAL | Status: AC
  Administered 2016-10-16: 40 mg via ORAL
  Filled 2016-10-16: qty 2

## 2016-10-16 MED ORDER — PREDNISONE 10 MG PO TABS: 20.0000 mg | ORAL_TABLET | Freq: Every day | ORAL | 0 refills | Status: DC

## 2016-10-16 MED ORDER — AZITHROMYCIN 250 MG PO TABS: ORAL_TABLET | ORAL | 0 refills | Status: DC

## 2016-10-16 MED ORDER — ALBUTEROL SULFATE HFA 108 (90 BASE) MCG/ACT IN AERS
2.0000 | INHALATION_SPRAY | Freq: Once | RESPIRATORY_TRACT | Status: AC
  Administered 2016-10-16: 2 via RESPIRATORY_TRACT
  Filled 2016-10-16: qty 6.7

## 2016-10-16 NOTE — ED Triage Notes (Signed)
Pt presents with sudden onset of R sided chest pain that began this morning.  Pt reports pain has been constant and radiates to scapula. +shortness of breath.  Pt also reports 2 week h/o productive cough.

## 2016-10-16 NOTE — ED Provider Notes (Signed)
[facility]-EM[facility]ENCY DEPT Provider Note   CSN: [id number] Arrival date & time: 10/16/16  1440     History   Chief Complaint Chief Complaint  Patient presents with  . Chest Pain    HPI Taylor May is a 36 y.o. female.  Right-sided chest pain since early this morning described as constant and feeling like a pulled muscle. Pain radiates to the back. She's been coughing for 2 weeks with productive sputum. She's been taking Mucinex with minimal relief. Nonsmoker. No chronic health problems. She works at a call center. Severity of symptoms is mild to moderate. No dyspnea, diaphoresis, nausea.      Past Medical History:  Diagnosis Date  . No pertinent past medical history     There are no active problems to display for this patient.   Past Surgical History:  Procedure Laterality Date  . GANGLION CYST EXCISION  1999   R wrist  . TOE SURGERY      OB History    Gravida Para Term Preterm AB Living   2 2 2  0 0 2   SAB TAB Ectopic Multiple Live Births   0 0 0 0 1       Home Medications    Prior to Admission medications   Medication Sig Start Date End Date Taking? Authorizing Provider  Phenylephrine-APAP-Guaifenesin (MUCINEX FAST-MAX) 10-650-400 MG/20ML LIQD Take 30 mLs by mouth daily as needed (congestion).   Yes Historical Provider, MD  azithromycin (ZITHROMAX) 250 MG tablet 1 tablet daily starting Sunday evening for 4 days 10/16/16   Donnetta Hutching, MD  predniSONE (DELTASONE) 10 MG tablet Take 2 tablets (20 mg total) by mouth daily. 10/16/16   Donnetta Hutching, MD    Family History Family History  Problem Relation Age of Onset  . Other Neg Hx     Social History Social History  Substance Use Topics  . Smoking status: Never Smoker  . Smokeless tobacco: Never Used  . Alcohol use No     Allergies   Codeine   Review of Systems Review of Systems  All other systems reviewed and are negative.    Physical Exam Updated Vital Signs BP 133/89   Pulse 66   Temp 98.3 F  (36.8 C) (Oral)   Resp 25   Ht 5\' 11"  (1.803 m)   Wt 210 lb (95.3 kg)   LMP 10/10/2016 (Exact Date)   SpO2 99%   BMI 29.29 kg/m   Physical Exam  Constitutional: She is oriented to person, place, and time. She appears well-developed and well-nourished.  HENT:  Head: Normocephalic and atraumatic.  Eyes: Conjunctivae are normal.  Neck: Neck supple.  Cardiovascular: Normal rate and regular rhythm.   Pulmonary/Chest: Effort normal and breath sounds normal.  Abdominal: Soft. Bowel sounds are normal.  Musculoskeletal: Normal range of motion.  Neurological: She is alert and oriented to person, place, and time.  Skin: Skin is warm and dry.  Psychiatric: She has a normal mood and affect. Her behavior is normal.  Nursing note and vitals reviewed.    ED Treatments / Results  Labs (all labs ordered are listed, but only abnormal results are displayed) Labs Reviewed  BASIC METABOLIC PANEL - Abnormal; Notable for the following:       Result Value   Potassium 3.3 (*)    Calcium 8.7 (*)    All other components within normal limits  URINALYSIS, ROUTINE W REFLEX MICROSCOPIC - Abnormal; Notable for the following:    Specific Gravity, Urine 1.032 (*)  Protein, ur 30 (*)    Squamous Epithelial / LPF 0-5 (*)    All other components within normal limits  CBC  D-DIMER, QUANTITATIVE (NOT AT [facility])  I-STAT TROPOININ, ED    EKG  EKG Interpretation  Date/Time:  Saturday October 16 2016 14:52:14 EST Ventricular Rate:  65 PR Interval:  144 QRS Duration: 84 QT Interval:  390 QTC Calculation: 405 R Axis:   88 Text Interpretation:  Normal sinus rhythm Normal ECG Confirmed by Adriana SimasOOK  MD, Genavive Kubicki ([id number]) on 10/16/2016 10:00:02 PM       Radiology Dg Chest 2 View  Result Date: 10/16/2016 CLINICAL DATA:  36 year old presenting with 2 week history of productive cough and acute onset of right-sided chest pain shortness of breath earlier today. EXAM: CHEST  2 VIEW COMPARISON:  None. FINDINGS:  Cardiomediastinal silhouette unremarkable. Lungs clear. Bronchovascular markings normal. Pulmonary vascularity normal. No pneumothorax. No pleural effusions. Visualized bony thorax intact. IMPRESSION: Normal examination. Electronically Signed   By: Hulan Saashomas  Lawrence M.D.   On: 10/16/2016 19:10    Procedures Procedures (including critical care time)  Medications Ordered in ED Medications  albuterol (PROVENTIL HFA;VENTOLIN HFA) 108 (90 Base) MCG/ACT inhaler 2 puff (2 puffs Inhalation Given 10/16/16 2006)  aerochamber plus with mask device (1 each  Given 10/16/16 2006)  azithromycin (ZITHROMAX) tablet 500 mg (500 mg Oral Given 10/16/16 2250)  predniSONE (DELTASONE) tablet 40 mg (40 mg Oral Given 10/16/16 2250)     Initial Impression / Assessment and Plan / ED Course  I have reviewed the triage vital signs and the nursing notes.  Pertinent labs & imaging results that were available during my care of the patient were reviewed by me and considered in my medical decision making (see chart for details).  Clinical Course     History and physical most consistent with bronchitis. EKG, chest x-ray, troponin, d-dimer all negative.  Will Rx antibiotic secondary to longevity of symptoms. Discharge medications Zithromax, prednisone, albuterol inhaler.  Final Clinical Impressions(s) / ED Diagnoses   Final diagnoses:  Chest pain, unspecified type  Bronchitis    New Prescriptions New Prescriptions   AZITHROMYCIN (ZITHROMAX) 250 MG TABLET    1 tablet daily starting Sunday evening for 4 days   PREDNISONE (DELTASONE) 10 MG TABLET    Take 2 tablets (20 mg total) by mouth daily.     Donnetta HutchingBrian Aariah Godette, MD 10/16/16 [phone]

## 2016-10-16 NOTE — Discharge Instructions (Signed)
Prescription for antibiotic, prednisone;  use your inhaler 2 puffs every 3-4 hours for coughing and wheezing. Increase fluids.

## 2017-02-18 DIAGNOSIS — M7651 Patellar tendinitis, right knee: Secondary | ICD-10-CM | POA: Diagnosis not present

## 2017-02-18 DIAGNOSIS — M238X2 Other internal derangements of left knee: Secondary | ICD-10-CM | POA: Diagnosis not present

## 2017-02-18 DIAGNOSIS — M7652 Patellar tendinitis, left knee: Secondary | ICD-10-CM | POA: Diagnosis not present

## 2017-02-28 DIAGNOSIS — M238X2 Other internal derangements of left knee: Secondary | ICD-10-CM | POA: Diagnosis not present

## 2017-03-15 DIAGNOSIS — M7652 Patellar tendinitis, left knee: Secondary | ICD-10-CM | POA: Diagnosis not present

## 2017-03-15 DIAGNOSIS — M7651 Patellar tendinitis, right knee: Secondary | ICD-10-CM | POA: Diagnosis not present

## 2017-03-15 DIAGNOSIS — M238X2 Other internal derangements of left knee: Secondary | ICD-10-CM | POA: Diagnosis not present

## 2017-07-19 DIAGNOSIS — J029 Acute pharyngitis, unspecified: Secondary | ICD-10-CM | POA: Diagnosis not present

## 2017-07-19 DIAGNOSIS — H1033 Unspecified acute conjunctivitis, bilateral: Secondary | ICD-10-CM | POA: Diagnosis not present

## 2017-08-18 DIAGNOSIS — R7309 Other abnormal glucose: Secondary | ICD-10-CM | POA: Diagnosis not present

## 2017-08-18 DIAGNOSIS — R5383 Other fatigue: Secondary | ICD-10-CM | POA: Diagnosis not present

## 2017-08-18 DIAGNOSIS — M62830 Muscle spasm of back: Secondary | ICD-10-CM | POA: Diagnosis not present

## 2017-08-18 DIAGNOSIS — H8303 Labyrinthitis, bilateral: Secondary | ICD-10-CM | POA: Diagnosis not present

## 2017-09-05 DIAGNOSIS — F430 Acute stress reaction: Secondary | ICD-10-CM | POA: Diagnosis not present

## 2017-09-05 DIAGNOSIS — D6489 Other specified anemias: Secondary | ICD-10-CM | POA: Diagnosis not present

## 2017-09-05 DIAGNOSIS — M62830 Muscle spasm of back: Secondary | ICD-10-CM | POA: Diagnosis not present

## 2017-11-24 IMAGING — CR DG CHEST 2V
2 series · 2 of 2 positions shown · non-contrast
Comparison: None.

CLINICAL DATA: 35-year-old presenting with 2 week history of
productive cough and acute onset of right-sided chest pain shortness
of breath earlier today.

EXAM:
CHEST  2 VIEW

[chest pa]
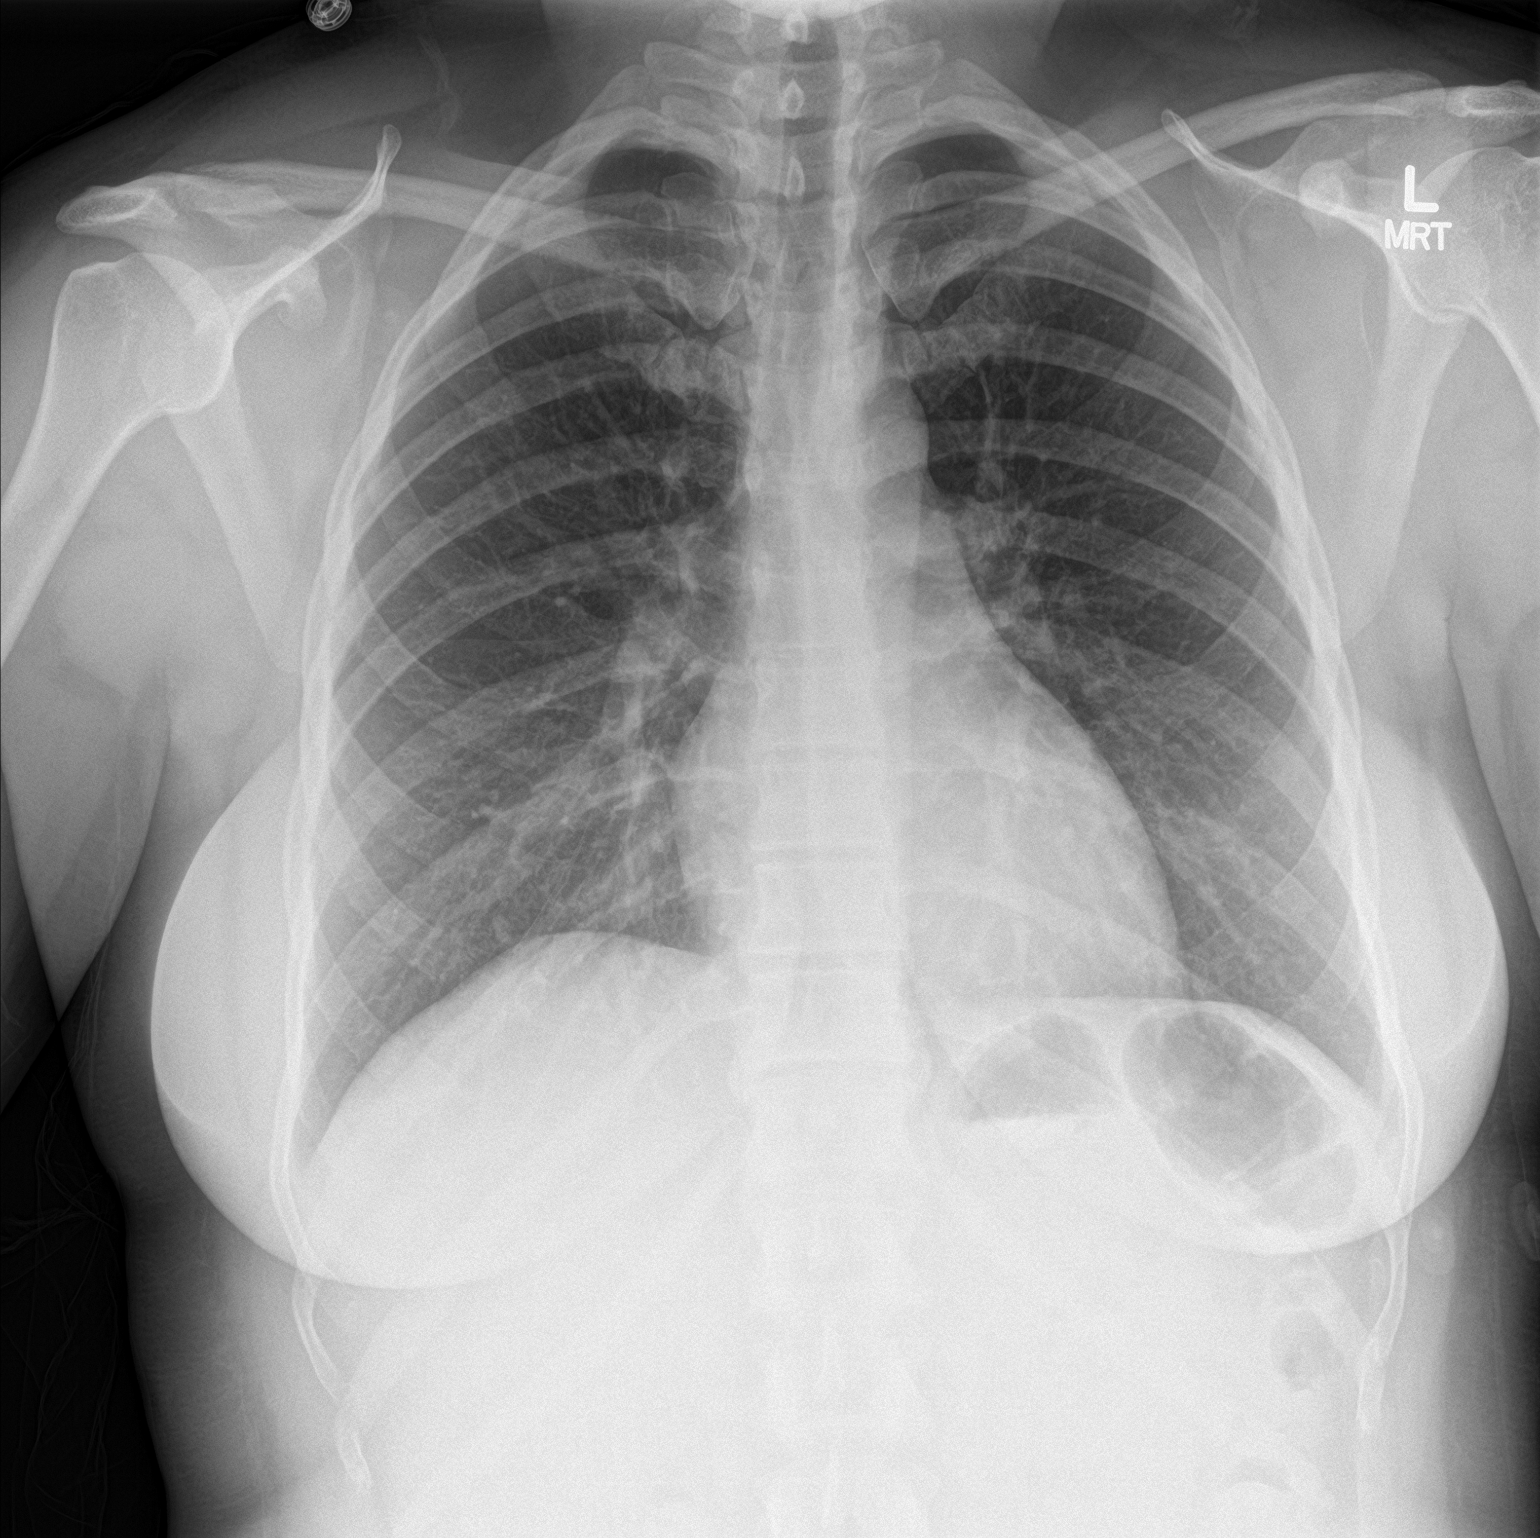

[chest lat]
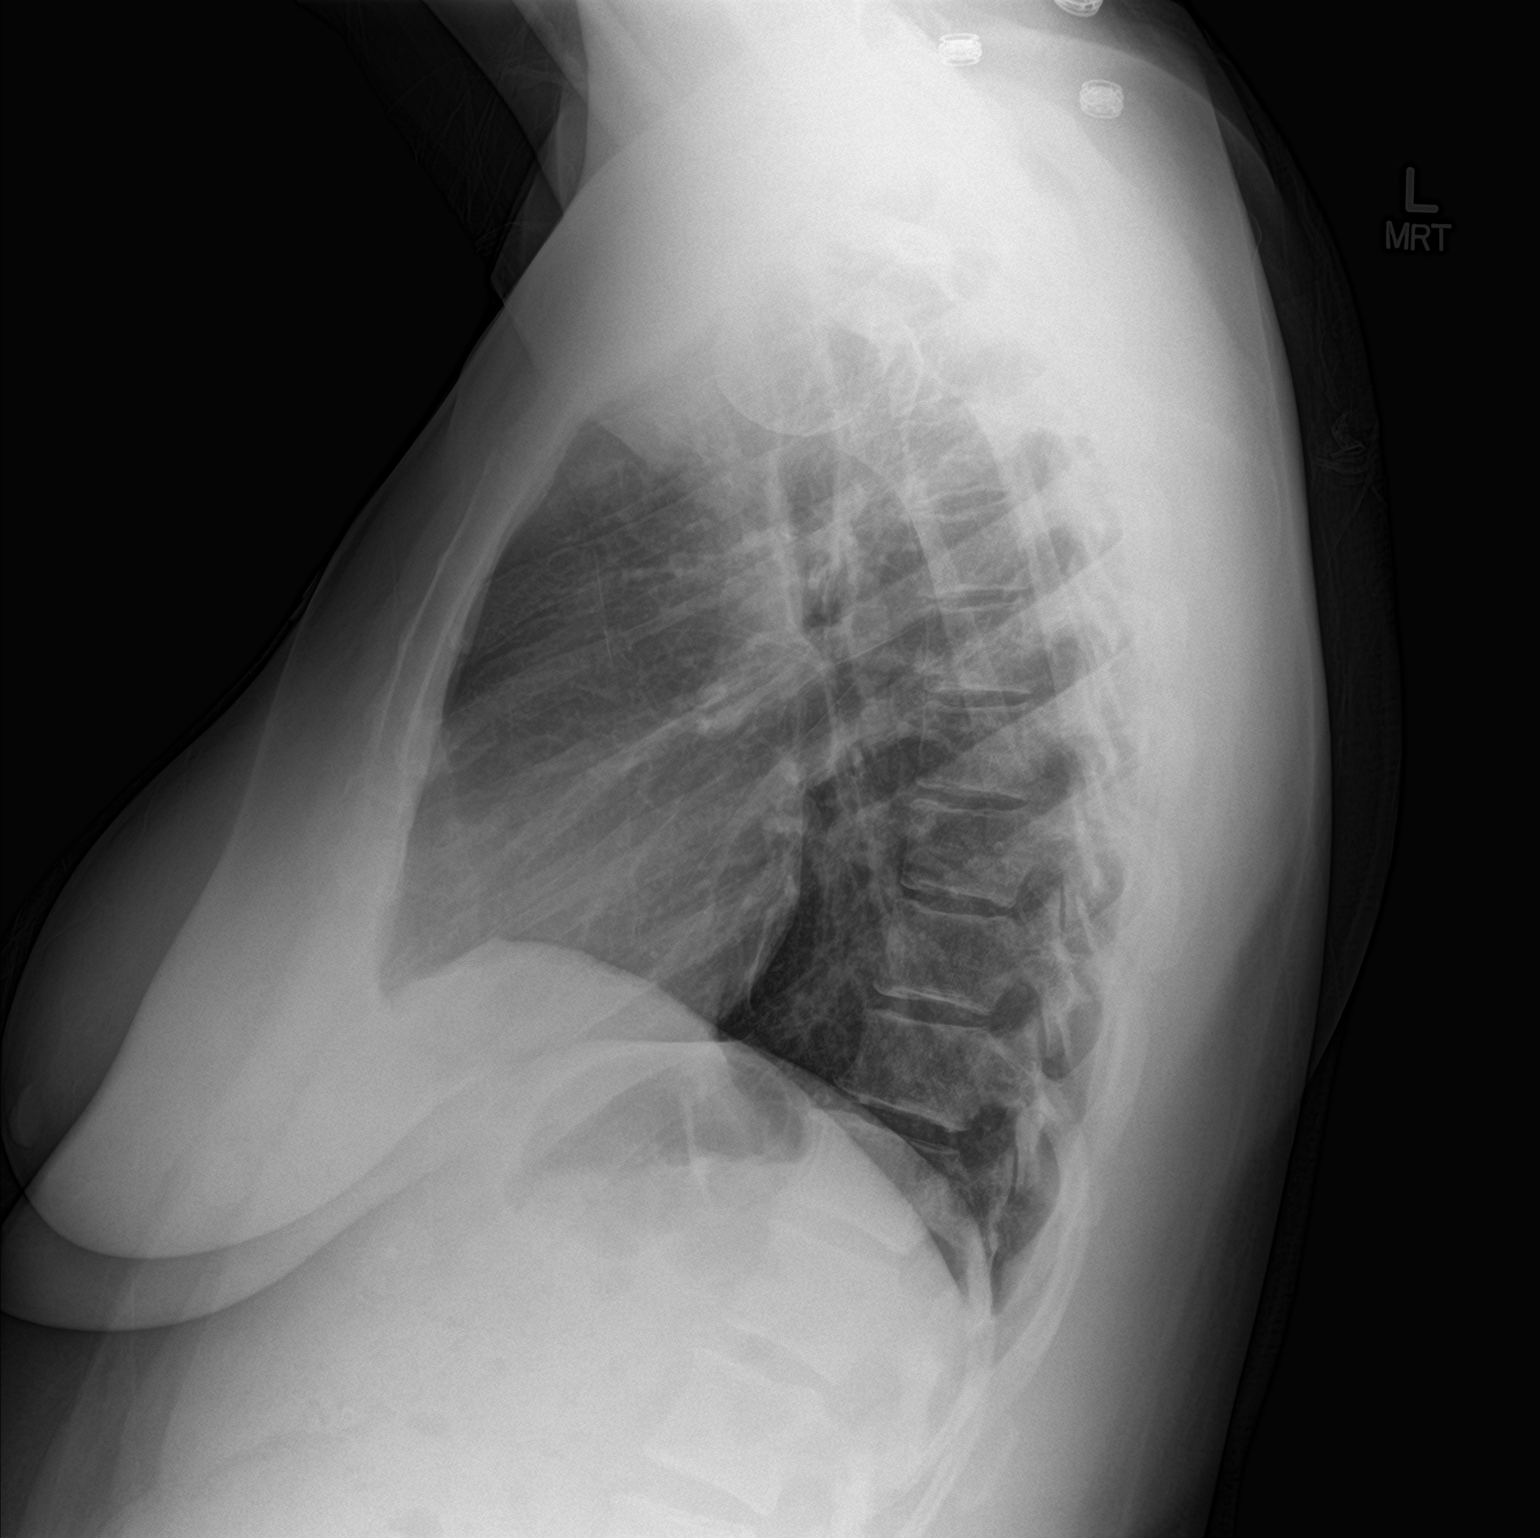

[2 of 2 positions shown; findings below may reference images not displayed]

FINDINGS: Cardiomediastinal silhouette unremarkable. Lungs clear.
Bronchovascular markings normal. Pulmonary vascularity normal. No
pneumothorax. No pleural effusions. Visualized bony thorax intact.
IMPRESSION: Normal examination.

## 2017-12-21 DIAGNOSIS — Z124 Encounter for screening for malignant neoplasm of cervix: Secondary | ICD-10-CM | POA: Diagnosis not present

## 2017-12-21 DIAGNOSIS — Z01419 Encounter for gynecological examination (general) (routine) without abnormal findings: Secondary | ICD-10-CM | POA: Diagnosis not present

## 2017-12-21 DIAGNOSIS — Z6831 Body mass index (BMI) 31.0-31.9, adult: Secondary | ICD-10-CM | POA: Diagnosis not present

## 2018-02-10 DIAGNOSIS — M542 Cervicalgia: Secondary | ICD-10-CM | POA: Diagnosis not present

## 2018-02-10 DIAGNOSIS — M25561 Pain in right knee: Secondary | ICD-10-CM | POA: Diagnosis not present

## 2018-02-10 DIAGNOSIS — M7542 Impingement syndrome of left shoulder: Secondary | ICD-10-CM | POA: Diagnosis not present

## 2018-03-15 DIAGNOSIS — Z6831 Body mass index (BMI) 31.0-31.9, adult: Secondary | ICD-10-CM | POA: Diagnosis not present

## 2018-03-15 DIAGNOSIS — N912 Amenorrhea, unspecified: Secondary | ICD-10-CM | POA: Diagnosis not present

## 2018-03-15 DIAGNOSIS — Z3202 Encounter for pregnancy test, result negative: Secondary | ICD-10-CM | POA: Diagnosis not present

## 2018-06-30 DIAGNOSIS — F411 Generalized anxiety disorder: Secondary | ICD-10-CM | POA: Insufficient documentation

## 2019-07-18 DIAGNOSIS — Z524 Kidney donor: Secondary | ICD-10-CM | POA: Insufficient documentation

## 2020-10-28 ENCOUNTER — Ambulatory Visit (HOSPITAL_COMMUNITY): Payer: Self-pay

## 2021-06-23 DIAGNOSIS — Z6830 Body mass index (BMI) 30.0-30.9, adult: Secondary | ICD-10-CM | POA: Diagnosis not present

## 2021-06-23 DIAGNOSIS — Z01419 Encounter for gynecological examination (general) (routine) without abnormal findings: Secondary | ICD-10-CM | POA: Diagnosis not present

## 2021-06-23 DIAGNOSIS — Z1231 Encounter for screening mammogram for malignant neoplasm of breast: Secondary | ICD-10-CM | POA: Diagnosis not present

## 2021-07-30 DIAGNOSIS — Z23 Encounter for immunization: Secondary | ICD-10-CM | POA: Diagnosis not present

## 2021-09-14 DIAGNOSIS — Z005 Encounter for examination of potential donor of organ and tissue: Secondary | ICD-10-CM | POA: Diagnosis not present

## 2021-10-17 DIAGNOSIS — Z23 Encounter for immunization: Secondary | ICD-10-CM | POA: Diagnosis not present

## 2021-10-31 DIAGNOSIS — Z23 Encounter for immunization: Secondary | ICD-10-CM | POA: Diagnosis not present

## 2022-01-04 DIAGNOSIS — H52223 Regular astigmatism, bilateral: Secondary | ICD-10-CM | POA: Diagnosis not present

## 2022-02-10 NOTE — Progress Notes (Signed)
?Subjective:  ? ? Taylor May - 41 y.o. female MRN [id number]  Date of birth: November 12, 1980 ? ?HPI ? ?Taylor May is to establish care and annual physical exam. ? ?Current issues and/or concerns: ?History of anemia: ?Reports history of anemia. When donating blood reports iron levels have been historically low. Taking over-the-counter iron supplements. No history of iron infusion.  ? ?2. Alopecia: ?Located mostly at back of head. Denies additional bodily hair loss. Has tried referral to Dermatology in the past but had extended wait times. Reports she is ok with alopecia as of present. Reports she will notify primary provider if she decides referral in the future.  ? ?3. Cervical cancer screening: ?4. Mammogram:  ?Reports up to date. Completed at [facility].  ? ?5. Anxiety depression:  ?Reports persisting for some time. Related to several factors. Mostly home-work life balance. Reports feeling on edge and experiencing insomnia. She does not partake in illicit substances. 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 Since then she has a new job which she dislikes. Also, making less money at current job which adds a level of financial stress. Has decreased communication with her husband as he has personal issues he is dealing with as well. Has a 42 year-old and 42 year-old. Thinks her youngest may have ADHD. Unsure if she wants to get her officially screened due to suggestion from some family members that maybe she shouldn't. Reports she is able to talk to her mother when she needs someone to listen. Reports has not tried medications in the past related to concern of side effects. Has not participated in counseling but would like to try. No current thoughts of self-harm, suicidal ideations, or homicidal ideations. Reports she has felt that she would be better off not being here but no active plans.  ?Anxious mood: yes  ?Excessive worrying: yes ?Irritability: yes ?Depressed mood: yes ?Insomnia: yes  ?Suicidal  ideations, homicidal ideations, self-harm: no  ?Recent Stressors/Life Changes: yes ?  Family stress: yes   ?  Financial stress: yes  ?  Job stress: yes  ?Insomnia: yes  ? ? ?  02/15/2022  ?  9:40 AM  ?Depression screen PHQ 2/9  ?Decreased Interest 1  ?Down, Depressed, Hopeless 3  ?PHQ - 2 Score 4  ?Altered sleeping 3  ?Tired, decreased energy 3  ?Change in appetite 1  ?Feeling bad or failure about yourself  2  ?Trouble concentrating 3  ?Moving slowly or fidgety/restless 2  ?Suicidal thoughts 1  ?PHQ-9 Score 19  ?Difficult doing work/chores Extremely dIfficult  ? ? ?ROS per HPI  ? ? ?Health Maintenance:  ?Health Maintenance Due  ?Topic Date Due  ? COVID-19 Vaccine (1) Never done  ? Hepatitis C Screening  Never done  ? ? ? ?Past Medical History: ?Patient Active Problem List  ? Diagnosis Date Noted  ? 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 GAD (generalized anxiety disorder) 06/30/2018  ? ? ? ? ?Social History  ? reports that she has never smoked. She has never been exposed to tobacco smoke. She has never used smokeless tobacco. She reports that she does not drink alcohol and does not use drugs.  ? ?Family History  ?family history is not on file.  ? ?Medications: reviewed and updated ?  ?Objective:  ? Physical Exam ?BP 126/85 (BP Location: Left Arm, Patient Position: Sitting, Cuff Size: Large)   Pulse 60   Temp 98.5 ?F (36.9 ?C)   Resp 18   Ht 5' 10"  (1.778 m)  Wt 215 lb (97.5 kg)   SpO2 98%   BMI 30.85 kg/m?  ? ?Physical Exam ?HENT:  ?   Head: Normocephalic and atraumatic.  ?   Comments: Alopecia present.  ?   Right Ear: Tympanic membrane, ear canal and external ear normal.  ?   Left Ear: Tympanic membrane, ear canal and external ear normal.  ?   Nose: Nose normal.  ?   Mouth/Throat:  ?   Mouth: Mucous membranes are moist.  ?   Pharynx: Oropharynx is clear.  ?Eyes:  ?   Extraocular Movements: Extraocular movements intact.  ?   Conjunctiva/sclera: Conjunctivae normal.  ?   Pupils: Pupils are equal, round, and reactive to  light.  ?Cardiovascular:  ?   Rate and Rhythm: Normal rate and regular rhythm.  ?   Pulses: Normal pulses.  ?   Heart sounds: Normal heart sounds.  ?Pulmonary:  ?   Effort: Pulmonary effort is normal.  ?   Breath sounds: Normal breath sounds.  ?Chest:  ?   Comments: Patient declined.  ?Abdominal:  ?   General: Bowel sounds are normal.  ?   Palpations: Abdomen is soft.  ?Genitourinary: ?   Comments: Patient declined.  ?Musculoskeletal:     ?   General: Normal range of motion.  ?   Cervical back: Normal range of motion and neck supple.  ?Skin: ?   General: Skin is warm and dry.  ?   Capillary Refill: Capillary refill takes less than 2 seconds.  ?Neurological:  ?   General: No focal deficit present.  ?   Mental Status: She is alert and oriented to person, place, and time.  ?Psychiatric:     ?   Attention and Perception: Attention normal.     ?   Mood and Affect: Affect is tearful.     ?   Speech: Speech normal.     ?   Behavior: Behavior normal.     ?   Thought Content: Thought content normal.     ?   Cognition and Memory: Cognition normal.  ? ?   ?Assessment & Plan:  ?1. Encounter to establish care: ?2. Annual physical exam: ?- Counseled on 150 minutes of exercise per week as tolerated, healthy eating (including decreased daily intake of saturated fats, cholesterol, added sugars, sodium), STI prevention, and routine healthcare maintenance. ? ?3. Screening for metabolic disorder: ?- QVZ56+LOVF to check kidney function, liver function, and electrolyte balance.  ?- CMP14+EGFR ? ?4. Screening for deficiency anemia: ?- CBC to screen for anemia.  ?- Iron panel added for further evaluation. ?- CBC ?- Iron, TIBC and Ferritin Panel ? ?5. Diabetes mellitus screening: ?- Hemoglobin A1c to screen for pre-diabetes/diabetes. ?- Hemoglobin A1c ? ?6. Screening cholesterol level: ?- Lipid panel to screen for high cholesterol.  ?- Lipid panel ? ?7. Thyroid disorder screen: ?- TSH to check thyroid function.  ?- TSH ? ?8. Encounter for  screening mammogram for malignant neoplasm of breast: ?9. Pap smear for cervical cancer screening: ?10. Routine screening for STI (sexually transmitted infection): ?- Patient reports up to date and completed at [place] [facility]. ? ?11. Need for hepatitis C screening test: ?- Hepatitis C antibody to screen for hepatitis C.  ?- Hepatitis C Antibody ? ?12. Alopecia: ?- Patient reports will notify primary provider if she decides referral to Dermatology in the future.  ? ?13. Anxiety and depression: ?- Patient denies thoughts of self-harm, suicidal ideations, homicidal ideations. ?- Begin Sertraline as  prescribed.  ?Do not drink alcohol or use illicit substances with with this medication.  ?Avoid driving or hazardous activity until you know how this medication will affect you. Your reactions could be impaired. Dizziness or fainting can cause falls, accidents, or severe injuries. ?Common side effects include drowsiness, nausea, constipation, loss of appetite, dry mouth, increased sweating. ?Call your provider if you have pounding heartbeats or fluttering in your chest, a light-headed feeling like you may pass out, easy bruising/unusal bleeding, vision change, difficult or painful urination, impotence/sexual problems, liver problems (right-sided upper stomach pain, itching, dark urine, yellowing of skin or eyes/jaundice, low levels of sodium in the body (headache, confusion, slurred speech, severe weakness, vomiting, loss of coordination, feeling unsteady), or manic episodes (racing thoughts, increased energy, decreased need for sleep, risk-taking behavior, being agitated, talkative) ?Seek medical attention immediately if you have symptoms of serotonin syndrome such as agitation, hallucinations, fever, sweating, shivering, fast heart rate, muscle stiffness, twitching, loss of coordination, nausea, vomiting, or diarrhea ?Report any new or worsening symptoms to your provider, such as but not limited to: mood or behavior  changes, anxiety, panic attacks, trouble sleeping, or if you feel impulsive, irritable, agitated, hostile, aggressive, restless, hyperactive (mentally or physically), more depressed, or have thoughts about suicide

## 2022-02-15 ENCOUNTER — Ambulatory Visit (INDEPENDENT_AMBULATORY_CARE_PROVIDER_SITE_OTHER): Payer: BC Managed Care – PPO | Admitting: Family

## 2022-02-15 ENCOUNTER — Encounter: Payer: Self-pay | Admitting: Family

## 2022-02-15 VITALS — BP 126/85 | HR 60 | Temp 98.5°F | Resp 18 | Ht 70.0 in | Wt 215.0 lb

## 2022-02-15 DIAGNOSIS — Z124 Encounter for screening for malignant neoplasm of cervix: Secondary | ICD-10-CM

## 2022-02-15 DIAGNOSIS — Z1159 Encounter for screening for other viral diseases: Secondary | ICD-10-CM | POA: Diagnosis not present

## 2022-02-15 DIAGNOSIS — Z1329 Encounter for screening for other suspected endocrine disorder: Secondary | ICD-10-CM

## 2022-02-15 DIAGNOSIS — Z131 Encounter for screening for diabetes mellitus: Secondary | ICD-10-CM | POA: Diagnosis not present

## 2022-02-15 DIAGNOSIS — Z13228 Encounter for screening for other metabolic disorders: Secondary | ICD-10-CM

## 2022-02-15 DIAGNOSIS — Z130 Encounter for screening for diseases of the blood and blood-forming organs and certain disorders involving the immune mechanism: Secondary | ICD-10-CM

## 2022-02-15 DIAGNOSIS — Z0000 Encounter for general adult medical examination without abnormal findings: Secondary | ICD-10-CM

## 2022-02-15 DIAGNOSIS — F32A Depression, unspecified: Secondary | ICD-10-CM

## 2022-02-15 DIAGNOSIS — Z1231 Encounter for screening mammogram for malignant neoplasm of breast: Secondary | ICD-10-CM

## 2022-02-15 DIAGNOSIS — F419 Anxiety disorder, unspecified: Secondary | ICD-10-CM

## 2022-02-15 DIAGNOSIS — L659 Nonscarring hair loss, unspecified: Secondary | ICD-10-CM

## 2022-02-15 DIAGNOSIS — Z7689 Persons encountering health services in other specified circumstances: Secondary | ICD-10-CM | POA: Diagnosis not present

## 2022-02-15 DIAGNOSIS — Z113 Encounter for screening for infections with a predominantly sexual mode of transmission: Secondary | ICD-10-CM

## 2022-02-15 DIAGNOSIS — Z13220 Encounter for screening for lipoid disorders: Secondary | ICD-10-CM | POA: Diagnosis not present

## 2022-02-15 MED ORDER — SERTRALINE HCL 25 MG PO TABS: 25.0000 mg | ORAL_TABLET | Freq: Every day | ORAL | 0 refills | Status: DC

## 2022-02-15 NOTE — Progress Notes (Signed)
Pt presents to establish care and annual physical w/o pap completed 06/23/2021 w/[facility]  ?pt states that when she goes to donate blood iron levels come back low  ? ?PhQ-9 score =19 extremely difficult ?

## 2022-02-15 NOTE — Patient Instructions (Signed)
Thank you for choosing Primary Care at [facility] for your medical home!   ? ?Taylor May was seen by Camillia Herter, NP today.  ? ?Julio Sicks Fallin's primary care provider is Camillia Herter, NP.   ?For the best care possible,  you should try to see Durene Fruits, NP ?whenever you come to office.  ? ?We look forward to seeing you again soon! ? ?If you have any questions about your visit today,  ?please call us at [phone] ? ?Or feel free to reach your provider via [facility].   ? ?Preventive Care 50-93 Years Old, Female ?Preventive care refers to lifestyle choices and visits with your health care provider that can promote health and wellness. Preventive care visits are also called wellness exams. ?What can I expect for my preventive care visit? ?Counseling ?Your health care provider may ask you questions about your: ?Medical history, including: ?Past medical problems. ?Family medical history. ?Pregnancy history. ?Current health, including: ?Menstrual cycle. ?Method of birth control. ?Emotional well-being. ?Home life and relationship well-being. ?Sexual activity and sexual health. ?Lifestyle, including: ?Alcohol, nicotine or tobacco, and drug use. ?Access to firearms. ?Diet, exercise, and sleep habits. ?Work and work Statistician. ?Sunscreen use. ?Safety issues such as seatbelt and bike helmet use. ?Physical exam ?Your health care provider will check your: ?Height and weight. These may be used to calculate your BMI (body mass index). BMI is a measurement that tells if you are at a healthy weight. ?Waist circumference. This measures the distance around your waistline. This measurement also tells if you are at a healthy weight and may help predict your risk of certain diseases, such as type 2 diabetes and high blood pressure. ?Heart rate and blood pressure. ?Body temperature. ?Skin for abnormal spots. ?What immunizations do I need? ? ?Vaccines are usually given at various ages, according to a schedule. Your health  care provider will recommend vaccines for you based on your age, medical history, and lifestyle or other factors, such as travel or where you work. ?What tests do I need? ?Screening ?Your health care provider may recommend screening tests for certain conditions. This may include: ?Lipid and cholesterol levels. ?Diabetes screening. This is done by checking your blood sugar (glucose) after you have not eaten for a while (fasting). ?Pelvic exam and Pap test. ?Hepatitis B test. ?Hepatitis C test. ?HIV (human immunodeficiency virus) test. ?STI (sexually transmitted infection) testing, if you are at risk. ?Lung cancer screening. ?Colorectal cancer screening. ?Mammogram. Talk with your health care provider about when you should start having regular mammograms. This may depend on whether you have a family history of breast cancer. ?BRCA-related cancer screening. This may be done if you have a family history of breast, ovarian, tubal, or peritoneal cancers. ?Bone density scan. This is done to screen for osteoporosis. ?Talk with your health care provider about your test results, treatment options, and if necessary, the need for more tests. ?Follow these instructions at home: ?Eating and drinking ? ?Eat a diet that includes fresh fruits and vegetables, whole grains, lean protein, and low-fat dairy products. ?Take vitamin and mineral supplements as recommended by your health care provider. ?Do not drink alcohol if: ?Your health care provider tells you not to drink. ?You are pregnant, may be pregnant, or are planning to become pregnant. ?If you drink alcohol: ?Limit how much you have to 0-1 drink a day. ?Know how much alcohol is in your drink. In the [place] one drink equals one 12 oz bottle of beer (355  mL), one 5 oz glass of wine (148 mL), or one 1? oz glass of hard liquor (44 mL). ?Lifestyle ?Brush your teeth every morning and night with fluoride toothpaste. Floss one time each day. ?Exercise for at least 30 minutes 5 or more  days each week. ?Do not use any products that contain nicotine or tobacco. These products include cigarettes, chewing tobacco, and vaping devices, such as e-cigarettes. If you need help quitting, ask your health care provider. ?Do not use drugs. ?If you are sexually active, practice safe sex. Use a condom or other form of protection to prevent STIs. ?If you do not wish to become pregnant, use a form of birth control. If you plan to become pregnant, see your health care provider for a prepregnancy visit. ?Take aspirin only as told by your health care provider. Make sure that you understand how much to take and what form to take. Work with your health care provider to find out whether it is safe and beneficial for you to take aspirin daily. ?Find healthy ways to manage stress, such as: ?Meditation, yoga, or listening to music. ?Journaling. ?Talking to a trusted person. ?Spending time with friends and family. ?Minimize exposure to UV radiation to reduce your risk of skin cancer. ?Safety ?Always wear your seat belt while driving or riding in a vehicle. ?Do not drive: ?If you have been drinking alcohol. Do not ride with someone who has been drinking. ?When you are tired or distracted. ?While texting. ?If you have been using any mind-altering substances or drugs. ?Wear a helmet and other protective equipment during sports activities. ?If you have firearms in your house, make sure you follow all gun safety procedures. ?Seek help if you have been physically or sexually abused. ?What's next? ?Visit your health care provider once a year for an annual wellness visit. ?Ask your health care provider how often you should have your eyes and teeth checked. ?Stay up to date on all vaccines. ?This information is not intended to replace advice given to you by your health care provider. Make sure you discuss any questions you have with your health care provider. ?Document Revised: 03/25/2021 Document Reviewed: 03/25/2021 ?Elsevier  Patient Education ? [place]. ? ?

## 2022-02-16 ENCOUNTER — Other Ambulatory Visit: Payer: Self-pay | Admitting: Family

## 2022-02-16 DIAGNOSIS — Z13228 Encounter for screening for other metabolic disorders: Secondary | ICD-10-CM

## 2022-02-16 LAB — IRON,TIBC AND FERRITIN PANEL
Ferritin: 24 ng/mL (ref 15–150)
Iron Saturation: 18 % (ref 15–55)
Iron: 69 ug/dL (ref 27–159)
Total Iron Binding Capacity: 386 ug/dL (ref 250–450)
UIBC: 317 ug/dL (ref 131–425)

## 2022-02-16 LAB — CMP14+EGFR
ALT: 14 IU/L (ref 0–32)
AST: 19 IU/L (ref 0–40)
Albumin/Globulin Ratio: 1.4 (ref 1.2–2.2)
Albumin: 4.6 g/dL (ref 3.8–4.8)
Alkaline Phosphatase: 60 IU/L (ref 44–121)
BUN/Creatinine Ratio: 13 (ref 9–23)
BUN: 16 mg/dL (ref 6–24)
Bilirubin Total: 0.5 mg/dL (ref 0.0–1.2)
CO2: 21 mmol/L (ref 20–29)
Calcium: 9.6 mg/dL (ref 8.7–10.2)
Chloride: 103 mmol/L (ref 96–106)
Creatinine, Ser: 1.19 mg/dL — ABNORMAL HIGH (ref 0.57–1.00)
Globulin, Total: 3.3 g/dL (ref 1.5–4.5)
Glucose: 86 mg/dL (ref 70–99)
Potassium: 4.3 mmol/L (ref 3.5–5.2)
Sodium: 137 mmol/L (ref 134–144)
Total Protein: 7.9 g/dL (ref 6.0–8.5)
eGFR: 59 mL/min/{1.73_m2} — ABNORMAL LOW (ref >59)

## 2022-02-16 LAB — LIPID PANEL
Chol/HDL Ratio: 3.2 ratio (ref 0.0–4.4)
Cholesterol, Total: 137 mg/dL (ref 100–199)
HDL: 43 mg/dL (ref >39)
LDL Chol Calc (NIH): 79 mg/dL (ref 0–99)
Triglycerides: 73 mg/dL (ref 0–149)
VLDL Cholesterol Cal: 15 mg/dL (ref 5–40)

## 2022-02-16 LAB — CBC
Hematocrit: 39.7 % (ref 34.0–46.6)
Hemoglobin: 12.5 g/dL (ref 11.1–15.9)
MCH: 26.5 pg — ABNORMAL LOW (ref 26.6–33.0)
MCHC: 31.5 g/dL (ref 31.5–35.7)
MCV: 84 fL (ref 79–97)
Platelets: 238 10*3/uL (ref 150–450)
RBC: 4.72 x10E6/uL (ref 3.77–5.28)
RDW: 15.1 % (ref 11.7–15.4)
WBC: 5.8 10*3/uL (ref 3.4–10.8)

## 2022-02-16 LAB — HEPATITIS C ANTIBODY: Hep C Virus Ab: NONREACTIVE

## 2022-02-16 LAB — HEMOGLOBIN A1C
Est. average glucose Bld gHb Est-mCnc: 111 mg/dL
Hgb A1c MFr Bld: 5.5 % (ref 4.8–5.6)

## 2022-02-16 LAB — TSH: TSH: 1.63 u[IU]/mL (ref 0.450–4.500)

## 2022-02-16 NOTE — Progress Notes (Signed)
-   Liver function normal.  ?- Thyroid function normal. ?- Cholesterol normal.  ?- Iron panel normal.  ?- No anemia.  ?- No diabetes.  ?- Hepatitis C negative.  ? ?The following abnormalities are noted:  ?- Kidney function not 100%. If you consume NSAID's such as Ibuprofen, Motrin, or Aleve consider limiting use. Reminder to keep hydrated with water. ? ?All other values are normal, stable or within acceptable limits. ? ?Medication changes / Follow up labs / Other changes or recommendations:   ?- Recheck kidney function in 4 to 6 weeks.  ? ?Rema Fendt, NP 02/16/2022 7:57 AM  ?

## 2022-03-09 ENCOUNTER — Other Ambulatory Visit: Payer: Self-pay | Admitting: Family

## 2022-03-09 DIAGNOSIS — F32A Depression, unspecified: Secondary | ICD-10-CM

## 2022-03-10 NOTE — Telephone Encounter (Signed)
Requested medications are due for refill today.  unsure  Requested medications are on the active medications list.  yes  Last refill. 5/8/20203 #30 0 refills - new medication for pt.  Future visit scheduled.   no  Notes to clinic.  Pharmacy needs dx code and requesting 90 day supply.    Requested Prescriptions  Pending Prescriptions Disp Refills   sertraline (ZOLOFT) 25 MG tablet [Pharmacy Med Name: SERTRALINE HCL 25 MG TABLET] 90 tablet 1    Sig: Take 1 tablet (25 mg total) by mouth daily.     Psychiatry:  Antidepressants - SSRI - sertraline Passed - 03/09/2022  1:31 PM      Passed - AST in normal range and within 360 days    AST  Date Value Ref Range Status  02/15/2022 19 0 - 40 IU/L Final         Passed - ALT in normal range and within 360 days    ALT  Date Value Ref Range Status  02/15/2022 14 0 - 32 IU/L Final         Passed - Completed PHQ-2 or PHQ-9 in the last 360 days      Passed - Valid encounter within last 6 months    Recent Outpatient Visits           3 weeks ago Encounter to establish care   [facility] at [facility], Amy J, NP   9 years ago Immunizations up to date   [facility] Care at [facility], Gay Filler, MD

## 2022-03-26 ENCOUNTER — Other Ambulatory Visit: Payer: Self-pay | Admitting: Family

## 2022-03-26 DIAGNOSIS — F419 Anxiety disorder, unspecified: Secondary | ICD-10-CM

## 2022-03-29 MED ORDER — SERTRALINE HCL 25 MG PO TABS: 25.0000 mg | ORAL_TABLET | Freq: Every day | ORAL | 2 refills | Status: DC

## 2022-03-29 NOTE — Telephone Encounter (Signed)
Refilled per patient request. 

## 2022-04-05 ENCOUNTER — Other Ambulatory Visit: Payer: Self-pay

## 2022-04-05 DIAGNOSIS — F419 Anxiety disorder, unspecified: Secondary | ICD-10-CM

## 2022-04-05 MED ORDER — SERTRALINE HCL 25 MG PO TABS: 25.0000 mg | ORAL_TABLET | Freq: Every day | ORAL | 0 refills | Status: DC

## 2022-05-06 ENCOUNTER — Ambulatory Visit: Payer: Self-pay | Admitting: *Deleted

## 2022-05-06 NOTE — Telephone Encounter (Signed)
Reason for Disposition  [1] Chest pain lasts > 5 minutes AND [2] occurred in past 3 days (72 hours) (Exception: Feels exactly the same as previously diagnosed heartburn and has accompanying sour taste in mouth.)  Answer Assessment - Initial Assessment Questions 1. LOCATION: "Where does it hurt?"       R upper 2. RADIATION: "Does the pain go anywhere else?" (e.g., into neck, jaw, arms, back)     Into back and into R arm 3. ONSET: "When did the chest pain begin?" (Minutes, hours or days)      3 weeks-pain worse today 4. PATTERN: "Does the pain come and go, or has it been constant since it started?"  "Does it get worse with exertion?"      Constant- reflux comes and goes 5. DURATION: "How long does it last" (e.g., seconds, minutes, hours)     Started 7am 6. SEVERITY: "How bad is the pain?"  (e.g., Scale 1-10; mild, moderate, or severe)    - MILD (1-3): doesn't interfere with normal activities     - MODERATE (4-7): interferes with normal activities or awakens from sleep    - SEVERE (8-10): excruciating pain, unable to do any normal activities       moderate 7. CARDIAC RISK FACTORS: "Do you have any history of heart problems or risk factors for heart disease?" (e.g., angina, prior heart attack; diabetes, high blood pressure, high cholesterol, smoker, or strong family history of heart disease)     no 8. PULMONARY RISK FACTORS: "Do you have any history of lung disease?"  (e.g., blood clots in lung, asthma, emphysema, birth control pills)     no 9. CAUSE: "What do you think is causing the chest pain?"     Unsure- thought acid reflux 10. OTHER SYMPTOMS: "Do you have any other symptoms?" (e.g., dizziness, nausea, vomiting, sweating, fever, difficulty breathing, cough)       no 11. PREGNANCY: "Is there any chance you are pregnant?" "When was your last menstrual period?"       *No Answer*  Protocols used: Chest Pain-A-AH

## 2022-05-06 NOTE — Telephone Encounter (Signed)
  Chief Complaint: URQ chest pain Symptoms: chest pain, arm/back pain Frequency: 3 weeks- worse today Pertinent Negatives: Patient denies  dizziness, nausea, vomiting, sweating, fever, difficulty breathing, cough Disposition: [x] ED /[] Urgent Care (no appt availability in office) / [] Appointment(In office/virtual)/ []  [facility]/ [] Home Care/ [] Refused Recommended Disposition /[] [facility]/ []  Follow-up with PCP Additional Notes: Patient has been treating for reflux for 3 weeks- not better-Advised ED per protocol

## 2022-09-10 NOTE — Progress Notes (Deleted)
Patient ID: Taylor May, female    DOB: 27-Mar-1981  MRN: [id number]  CC: No chief complaint on file.   Subjective: Taylor May is a 41 y.o. female who presents for  Her concerns today include:  per pt feeling funny feels like pre-menopause  Psych referral note - Patient looking for a therapist. Gave other resources.    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 30 tablet 2   sertraline (ZOLOFT) 25 MG tablet Take 1 tablet (25 mg total) by mouth daily. 90 tablet 0   No current facility-administered medications on file prior to visit.    Allergies  Allergen Reactions   Codeine Hives, Other (See Comments) and Itching    hallucinations hallucinations     Social History   Socioeconomic History   Marital status: Married    Spouse name: Not on file   Number of children: Not on file   Years of education: Not on file   Highest education level: Not on file  Occupational History   Not on file  Tobacco Use   Smoking status: Never    Passive exposure: Never   Smokeless tobacco: Never  Vaping Use   Vaping Use: Never used  Substance and Sexual Activity   Alcohol use: No   Drug use: No   Sexual activity: Yes  Other Topics Concern   Not on file  Social History Narrative   Not on file   Social Determinants of Health   Financial Resource Strain: Not on file  Food Insecurity: Not on file  Transportation Needs: Not on file  Physical Activity: Not on file  Stress: Not on file  Social Connections: Not on file  Intimate Partner Violence: Not on file    Family History  Problem Relation Age of Onset   Other Neg Hx     Past Surgical History:  Procedure Laterality Date    GANGLION CYST EXCISION  1999   R wrist   TOE SURGERY      ROS: Review of Systems Negative except as stated above  PHYSICAL EXAM: There were no vitals taken for this visit.  Physical Exam  {female adult master:310786} {female adult master:310785}     Latest Ref Rng & Units 02/15/2022   10:21 AM 10/16/2016    3:12 PM  CMP  Glucose 70 - 99 mg/dL 86  93   BUN 6 - 24 mg/dL 16  14   Creatinine 0.94 - 1.00 mg/dL 0.76  8.08   Sodium 811 - 144 mmol/L 137  138   Potassium 3.5 - 5.2 mmol/L 4.3  3.3   Chloride 96 - 106 mmol/L 103  104   CO2 20 - 29 mmol/L 21  24   Calcium 8.7 - 10.2 mg/dL 9.6  8.7   Total Protein 6.0 - 8.5 g/dL 7.9    Total Bilirubin 0.0 - 1.2 mg/dL 0.5    Alkaline Phos 44 - 121 IU/L 60    AST 0 - 40 IU/L 19    ALT 0 - 32 IU/L 14     Lipid Panel     Component Value Date/Time   CHOL 137 02/15/2022 1021   TRIG 73 02/15/2022  1021   HDL 43 02/15/2022 1021   CHOLHDL 3.2 02/15/2022 1021   LDLCALC 79 02/15/2022 1021    CBC    Component Value Date/Time   WBC 5.8 02/15/2022 1021   WBC 9.3 10/16/2016 1512   RBC 4.72 02/15/2022 1021   RBC 4.68 10/16/2016 1512   HGB 12.5 02/15/2022 1021   HCT 39.7 02/15/2022 1021   PLT 238 02/15/2022 1021   MCV 84 02/15/2022 1021   MCH 26.5 (L) 02/15/2022 1021   MCH 26.1 10/16/2016 1512   MCHC 31.5 02/15/2022 1021   MCHC 33.2 10/16/2016 1512   RDW 15.1 02/15/2022 1021    ASSESSMENT AND PLAN:  There are no diagnoses linked to this encounter.   Patient was given the opportunity to ask questions.  Patient verbalized understanding of the plan and was able to repeat key elements of the plan. Patient was given clear instructions to go to Emergency Department or return to medical center if symptoms don't improve, worsen, or new problems develop.The patient verbalized understanding.   No orders of the defined types were placed in this encounter.    Requested Prescriptions    No prescriptions requested or ordered in this  encounter    No follow-ups on file.  Rema Fendt, NP

## 2022-09-17 ENCOUNTER — Ambulatory Visit: Payer: BC Managed Care – PPO | Admitting: Family

## 2022-09-17 DIAGNOSIS — Z13228 Encounter for screening for other metabolic disorders: Secondary | ICD-10-CM

## 2022-09-23 NOTE — Progress Notes (Signed)
Patient ID: Taylor May, female    DOB: 11-Jun-1981  MRN: [id number]  CC: Anxiety Depression Follow-Up  Subjective: Taylor May is a 41 y.o. female who presents for anxiety depression follow-up.   Her concerns today include:  - Since last visit no longer taking Sertraline. States she did take Sertraline for a few days after her last visit. However, she does not like taking medications daily so discontinued use. Since last visit no significant change of events or new contributing factors to anxiety depression. She is ready to begin medication regimen again and states she will try to adhere. She did meet with a therapist but was not a good patient experience. She is currently looking for a new therapist. She is aware to notify me if she would like a referral to the same in the future. Denies thoughts of self-harm, suicidal ideations, and homicidal ideations.  - Concerns for pre-menopause. Endorses night sweats. She does sleep in thin layers. Periods happen once or sometimes twice monthly. She does not keep track of her period that much because her husband had a vasectomy. Reports period with heavy flow and with mild side effects. No known history of early menopause in her family.  - History of alopecia. Recently noticed hair follicles growing. She did see Dermatology in the past and ready for referral back.      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   No current facility-administered medications on file prior to visit.    Allergies  Allergen Reactions   Codeine Hives, Other (See Comments) and Itching    hallucinations hallucinations     Social History   Socioeconomic History   Marital status: Married    Spouse name: Not on file   Number of children: Not on file   Years of education: Not on file   Highest education level: Not on file  Occupational History   Not on file  Tobacco Use   Smoking status: Never    Passive exposure: Never   Smokeless tobacco: Never  Vaping Use   Vaping Use: Never used  Substance and Sexual Activity   Alcohol use: No   Drug use: No   Sexual activity: Yes  Other Topics Concern   Not on file  Social History Narrative   Not on file   Social Determinants of Health   Financial Resource Strain: Not on file  Food Insecurity: Not on file  Transportation Needs: Not on file  Physical Activity: Not on file  Stress: Not on file  Social Connections: Not on file  Intimate Partner Violence: Not on file    Family History  Problem Relation Age of Onset   Other Neg Hx     Past Surgical History:  Procedure  Laterality Date   GANGLION CYST EXCISION  1999   R wrist   TOE SURGERY      ROS: Review of Systems Negative except as stated above  PHYSICAL EXAM: BP 131/88 (BP Location: Left Arm, Patient Position: Sitting, Cuff Size: Large)   Pulse 65   Temp 98.3 F (36.8 C)   Resp 16   Ht 5\' 10"  (1.778 m)   Wt 220 lb (99.8 kg)   SpO2 99%   BMI 31.57 kg/m   Physical Exam HENT:     Head: Normocephalic and atraumatic.  Eyes:     Extraocular Movements: Extraocular movements intact.     Conjunctiva/sclera: Conjunctivae normal.     Pupils: Pupils are equal, round, and reactive to light.  Cardiovascular:     Rate and Rhythm: Normal rate and regular rhythm.     Pulses: Normal pulses.     Heart sounds: Normal heart  sounds.  Pulmonary:     Effort: Pulmonary effort is normal.     Breath sounds: Normal breath sounds.  Musculoskeletal:     Cervical back: Normal range of motion and neck supple.  Neurological:     General: No focal deficit present.     Mental Status: She is alert and oriented to person, place, and time.  Psychiatric:        Mood and Affect: Mood normal.        Behavior: Behavior normal.     ASSESSMENT AND PLAN: 1. Anxiety and depression - Patient denies thoughts of self-harm, suicidal ideations, homicidal ideations. - Resume Sertraline as prescribed.  - Hydroxyzine as prescribed. Counseled on medication adherence/adverse effects.  - Patient intends to find her own therapist. She is aware to notify me if she would like assistance with this in the future.  - Follow-up with primary provider in 4 weeks or sooner if needed.  - sertraline (ZOLOFT) 25 MG tablet; Take 1 tablet (25 mg total) by mouth daily.  Dispense: 30 tablet; Refill: 0 - hydrOXYzine (VISTARIL) 25 MG capsule; Take 1 capsule (25 mg total) by mouth every 8 (eight) hours as needed.  Dispense: 30 capsule; Refill: 0  2. Menorrhagia with irregular cycle 3. Night sweats - Routine labs.  - Referral to Gynecology for further evaluation/management.  - Follicle stimulating hormone - Luteinizing hormone - Ambulatory referral to Gynecology  4. Alopecia - Referral to Dermatology for further evaluation/management.  - Ambulatory referral to Dermatology   Patient was given the opportunity to ask questions.  Patient verbalized understanding of the plan and was able to repeat key elements of the plan. Patient was given clear instructions to go to Emergency Department or return to medical center if symptoms don't improve, worsen, or new problems develop.The patient verbalized understanding.   Orders Placed This Encounter  Procedures   Follicle stimulating hormone   Luteinizing hormone   Ambulatory referral to Dermatology    Ambulatory referral to Gynecology     Requested Prescriptions   Signed Prescriptions Disp Refills   sertraline (ZOLOFT) 25 MG tablet 30 tablet 0    Sig: Take 1 tablet (25 mg total) by mouth daily.   hydrOXYzine (VISTARIL) 25 MG capsule 30 capsule 0    Sig: Take 1 capsule (25 mg total) by mouth every 8 (eight) hours as needed.    Return in about 4 weeks (around 10/22/2022) for Follow-Up or next available anxiety depression .  12/21/2022, NP

## 2022-09-24 ENCOUNTER — Ambulatory Visit (INDEPENDENT_AMBULATORY_CARE_PROVIDER_SITE_OTHER): Payer: BC Managed Care – PPO | Admitting: Family

## 2022-09-24 VITALS — BP 131/88 | HR 65 | Temp 98.3°F | Resp 16 | Ht 70.0 in | Wt 220.0 lb

## 2022-09-24 DIAGNOSIS — R61 Generalized hyperhidrosis: Secondary | ICD-10-CM | POA: Diagnosis not present

## 2022-09-24 DIAGNOSIS — F419 Anxiety disorder, unspecified: Secondary | ICD-10-CM

## 2022-09-24 DIAGNOSIS — N921 Excessive and frequent menstruation with irregular cycle: Secondary | ICD-10-CM | POA: Diagnosis not present

## 2022-09-24 DIAGNOSIS — L659 Nonscarring hair loss, unspecified: Secondary | ICD-10-CM | POA: Diagnosis not present

## 2022-09-24 DIAGNOSIS — F32A Depression, unspecified: Secondary | ICD-10-CM

## 2022-09-24 MED ORDER — HYDROXYZINE PAMOATE 25 MG PO CAPS: 25.0000 mg | ORAL_CAPSULE | Freq: Three times a day (TID) | ORAL | 0 refills | Status: AC | PRN

## 2022-09-24 MED ORDER — SERTRALINE HCL 25 MG PO TABS: 25.0000 mg | ORAL_TABLET | Freq: Every day | ORAL | 0 refills | Status: AC

## 2022-09-24 NOTE — Progress Notes (Signed)
Pt presents for anxiety -fatigued and feeling depressed -night sweats -pt only took Sertraline a few days once prescribed and haven't started back

## 2022-09-25 LAB — FOLLICLE STIMULATING HORMONE: FSH: 7.1 m[IU]/mL

## 2022-09-25 LAB — LUTEINIZING HORMONE: LH: 3.9 m[IU]/mL

## 2022-10-19 ENCOUNTER — Ambulatory Visit: Payer: BLUE CROSS/BLUE SHIELD | Admitting: Radiology

## 2023-01-10 DIAGNOSIS — Z1231 Encounter for screening mammogram for malignant neoplasm of breast: Secondary | ICD-10-CM | POA: Diagnosis not present

## 2023-01-10 DIAGNOSIS — Z01419 Encounter for gynecological examination (general) (routine) without abnormal findings: Secondary | ICD-10-CM | POA: Diagnosis not present

## 2023-01-10 DIAGNOSIS — Z124 Encounter for screening for malignant neoplasm of cervix: Secondary | ICD-10-CM | POA: Diagnosis not present

## 2023-01-10 DIAGNOSIS — Z6831 Body mass index (BMI) 31.0-31.9, adult: Secondary | ICD-10-CM | POA: Diagnosis not present

## 2023-04-25 ENCOUNTER — Telehealth: Payer: Self-pay | Admitting: Family

## 2023-04-25 NOTE — Telephone Encounter (Signed)
Called pt and left vm to call office back to schedule appt requested via MyChart. 

## 2023-11-26 ENCOUNTER — Other Ambulatory Visit: Payer: Self-pay

## 2023-11-26 ENCOUNTER — Encounter (HOSPITAL_BASED_OUTPATIENT_CLINIC_OR_DEPARTMENT_OTHER): Payer: Self-pay

## 2023-11-26 ENCOUNTER — Emergency Department (HOSPITAL_BASED_OUTPATIENT_CLINIC_OR_DEPARTMENT_OTHER)
Admission: EM | Admit: 2023-11-26 | Discharge: 2023-11-26 | Disposition: A | Discharge status: Home / Self Care | Payer: BC Managed Care – PPO | Attending: Emergency Medicine | Admitting: Emergency Medicine

## 2023-11-26 DIAGNOSIS — T148XXA Other injury of unspecified body region, initial encounter: Secondary | ICD-10-CM

## 2023-11-26 DIAGNOSIS — R202 Paresthesia of skin: Secondary | ICD-10-CM | POA: Diagnosis not present

## 2023-11-26 DIAGNOSIS — S39012A Strain of muscle, fascia and tendon of lower back, initial encounter: Secondary | ICD-10-CM | POA: Insufficient documentation

## 2023-11-26 DIAGNOSIS — S3992XA Unspecified injury of lower back, initial encounter: Secondary | ICD-10-CM | POA: Diagnosis not present

## 2023-11-26 DIAGNOSIS — X58XXXA Exposure to other specified factors, initial encounter: Secondary | ICD-10-CM | POA: Diagnosis not present

## 2023-11-26 MED ORDER — METHOCARBAMOL 500 MG PO TABS: 500.0000 mg | ORAL_TABLET | Freq: Three times a day (TID) | ORAL | 0 refills | Status: AC | PRN

## 2023-11-26 MED ORDER — KETOROLAC TROMETHAMINE 60 MG/2ML IM SOLN
30.0000 mg | Freq: Once | INTRAMUSCULAR | Status: AC
  Administered 2023-11-26: 30 mg via INTRAMUSCULAR
  Filled 2023-11-26: qty 2

## 2023-11-26 MED ORDER — METHOCARBAMOL 500 MG PO TABS
1000.0000 mg | ORAL_TABLET | Freq: Once | ORAL | Status: AC
  Administered 2023-11-26: 1000 mg via ORAL
  Filled 2023-11-26: qty 2

## 2023-11-26 MED ORDER — NAPROXEN 375 MG PO TABS: 375.0000 mg | ORAL_TABLET | Freq: Two times a day (BID) | ORAL | 0 refills | Status: AC

## 2023-11-26 NOTE — Discharge Instructions (Addendum)
 In addition to your prescribed medication, you can take Acetaminophen (Tylenol), topical muscle creams such as SalonPas, [employer], Bengay, etc. Please stretch, apply ice or heat (whichever helps), and have massage therapy for additional assistance.  For pain control you may take 1000 mg of acetaminophen (Tylenol) every 8 hours as needed.  Please limit 24 hr usage of the following medicines: - Acetaminophen (Tylenol) to 4000 mg

## 2023-11-26 NOTE — ED Provider Notes (Signed)
 [place] EMERGENCY DEPARTMENT AT [facility] Provider Note  CSN: [id number] Arrival date & time: 11/26/23 2204  Chief Complaint(s) Leg Pain  HPI Taylor May is a 42 y.o. female with a past medical history listed below who presents to the emergency department with 3 days of persistent right lower back pain radiating down to the leg and up into her right shoulder.  Worse with movement and palpation.  Patient denies any fall or trauma.  She endorses paresthesias to the right fingers and toes.  Denies any weakness.  No other physical complaints.  The history is provided by the patient.    Past Medical History Past Medical History:  Diagnosis Date   No pertinent past medical history    Patient Active Problem List   Diagnosis Date Noted   Anxiety and depression 02/15/2022   Donor, kidney 07/18/2019   GAD (generalized anxiety disorder) 06/30/2018   Home Medication(s) Prior to Admission medications   Medication Sig Start Date End Date Taking? Authorizing Provider  methocarbamol (ROBAXIN) 500 MG tablet Take 1-2 tablets (500-1,000 mg total) by mouth every 8 (eight) hours as needed for muscle spasms. 11/26/23  Yes Deshon Hsiao, Amadeo Garnet, MD  naproxen (NAPROSYN) 375 MG tablet Take 1 tablet (375 mg total) by mouth 2 (two) times daily. 11/26/23  Yes Brandley Aldrete, Amadeo Garnet, MD  hydrOXYzine (VISTARIL) 25 MG capsule Take 1 capsule (25 mg total) by mouth every 8 (eight) hours as needed. 09/24/22   Rema Fendt, NP  sertraline (ZOLOFT) 25 MG tablet Take 1 tablet (25 mg total) by mouth daily. 09/24/22 10/24/22  Rema Fendt, NP                                                                                                                                    Allergies Codeine  Review of Systems Review of Systems As noted in HPI  Physical Exam Vital Signs  I have reviewed the triage vital signs BP (!) 142/90   Pulse 62   Temp 98.4 F (36.9 C)   Resp (!) 22   Ht 5\' 10"  (1.778  m)   Wt 99.8 kg   LMP 11/26/2023   SpO2 96%   BMI 31.57 kg/m   Physical Exam Vitals reviewed.  Constitutional:      General: She is not in acute distress.    Appearance: She is well-developed. She is not diaphoretic.  HENT:     Head: Normocephalic and atraumatic.     Right Ear: External ear normal.     Left Ear: External ear normal.     Nose: Nose normal.  Eyes:     General: No scleral icterus.    Conjunctiva/sclera: Conjunctivae normal.  Neck:     Trachea: Phonation normal.  Cardiovascular:     Rate and Rhythm: Normal rate and regular rhythm.  Pulmonary:     Effort: Pulmonary effort is normal. No respiratory distress.  Breath sounds: No stridor.  Abdominal:     General: There is no distension.  Musculoskeletal:        General: Normal range of motion.     Right hand: Normal strength. Normal sensation.     Left hand: Normal strength. Normal sensation.     Cervical back: Normal range of motion. Spasms and tenderness present.     Thoracic back: Spasms and tenderness present.     Lumbar back: Spasms and tenderness present.       Back:     Comments: Intact sensation and strength to BLE  Neurological:     Mental Status: She is alert and oriented to person, place, and time.  Psychiatric:        Behavior: Behavior normal.     ED Results and Treatments Labs (all labs ordered are listed, but only abnormal results are displayed) Labs Reviewed - No data to display                                                                                                                       EKG  EKG Interpretation Date/Time:    Ventricular Rate:    PR Interval:    QRS Duration:    QT Interval:    QTC Calculation:   R Axis:      Text Interpretation:         Radiology No results found.  Medications Ordered in ED Medications  ketorolac (TORADOL) injection 30 mg (has no administration in time range)  methocarbamol (ROBAXIN) tablet 1,000 mg (has no administration in time  range)   Procedures Procedures  (including critical care time) Medical Decision Making / ED Course   Medical Decision Making Risk Prescription drug management.    43 y.o. female presents with right sided back pain with UE and LE paresthesias. No acute traumatic onset. No red flag symptoms of fever, weight loss, saddle anesthesia, weakness, fecal/urinary incontinence or urinary retention.   Suspect MSK etiology.  No indication for imaging emergently. Patient was recommended to take Tylenol and muscle relaxers and as needed short course of NSAIDs given single kidney. Engage in early mobility as definitive treatment. Return precautions discussed for worsening or new concerning symptoms.      Final Clinical Impression(s) / ED Diagnoses Final diagnoses:  Muscle strain   The patient appears reasonably screened and/or stabilized for discharge and I doubt any other medical condition or other [facility] requiring further screening, evaluation, or treatment in the ED at this time. I have discussed the findings, Dx and Tx plan with the patient/family who expressed understanding and agree(s) with the plan. Discharge instructions discussed at length. The patient/family was given strict return precautions who verbalized understanding of the instructions. No further questions at time of discharge.  Disposition: Discharge  Condition: Good  ED Discharge Orders          Ordered    methocarbamol (ROBAXIN) 500 MG tablet  Every 8 hours PRN  11/26/23 2323    naproxen (NAPROSYN) 375 MG tablet  2 times daily        11/26/23 2323              Follow Up: Rema Fendt, NP [address] [place] [address] [phone]  Call  to schedule an appointment for close follow up    This chart was dictated using voice recognition software.  Despite best efforts to proofread,  errors can occur which can change the documentation meaning.    Nira Conn, MD 11/26/23  [phone]

## 2023-11-26 NOTE — ED Triage Notes (Signed)
 Pt POV with husband d/t right lower back that shoots down leg causing some numbness in fingers and toes 3 days ago.  Pt also states pain shoots up left neck started today.

## 2024-01-13 DIAGNOSIS — Z01419 Encounter for gynecological examination (general) (routine) without abnormal findings: Secondary | ICD-10-CM | POA: Diagnosis not present

## 2024-01-13 DIAGNOSIS — Z1231 Encounter for screening mammogram for malignant neoplasm of breast: Secondary | ICD-10-CM | POA: Diagnosis not present

## 2024-02-29 ENCOUNTER — Ambulatory Visit (INDEPENDENT_AMBULATORY_CARE_PROVIDER_SITE_OTHER): Admitting: Family

## 2024-02-29 VITALS — BP 131/88 | HR 64 | Temp 97.1°F | Wt 228.2 lb

## 2024-02-29 DIAGNOSIS — R0989 Other specified symptoms and signs involving the circulatory and respiratory systems: Secondary | ICD-10-CM

## 2024-02-29 MED ORDER — PREDNISONE 10 MG PO TABS: ORAL_TABLET | ORAL | 0 refills | Status: AC

## 2024-02-29 MED ORDER — AMOXICILLIN-POT CLAVULANATE 875-125 MG PO TABS
1.0000 | ORAL_TABLET | Freq: Two times a day (BID) | ORAL | 0 refills | Status: AC
Start: 2024-02-29 — End: ?

## 2024-02-29 NOTE — Progress Notes (Signed)
 Patient ID: JAZZ BIDDY, female    DOB: Aug 16, 1981  MRN: [id number]  CC: Nasal Congestion  Subjective: Taylor May is a 43 y.o. female who presents for nasal congestion.  Her concerns today include:  Patient reports nasal congestion for more than 1 week. Denies red flag symptoms. Taking over-the-counter medications with minimal relief.  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 (Patient not taking: Reported on 02/29/2024) 30 capsule 0   methocarbamol  (ROBAXIN ) 500 MG tablet Take 1-2 tablets (500-1,000 mg total) by mouth every 8 (eight) hours as needed for muscle spasms. (Patient not taking: Reported on 02/29/2024) 20 tablet 0   naproxen  (NAPROSYN ) 375 MG tablet Take 1 tablet (375 mg total) by mouth 2 (two) times daily. (Patient not taking: Reported on 02/29/2024) 20 tablet 0   sertraline  (ZOLOFT ) 25 MG tablet Take 1 tablet (25 mg total) by mouth daily. 30 tablet 0   No current facility-administered medications on file prior to visit.    Allergies  Allergen Reactions   Codeine Hives, Other (See Comments) and Itching    hallucinations hallucinations     Social History   Socioeconomic History   Marital status: Married    Spouse name: Not on file   Number of children: Not on file   Years of education: Not on file   Highest education level: Some college, no degree  Occupational History   Not on file  Tobacco Use   Smoking status: Never    Passive exposure: Never   Smokeless tobacco: Never  Vaping Use   Vaping status: Never Used  Substance and Sexual Activity   Alcohol use: No   Drug use: No   Sexual activity: Yes  Other Topics Concern   Not on file   Social History Narrative   Not on file   Social Drivers of Health   Financial Resource Strain: Medium Risk (02/29/2024)   Overall Financial Resource Strain (CARDIA)    Difficulty of Paying Living Expenses: Somewhat hard  Food Insecurity: No Food Insecurity (02/29/2024)   Hunger Vital Sign    Worried About Running Out of Food in the Last Year: Never true    Ran Out of Food in the Last Year: Never true  Transportation Needs: No Transportation Needs (02/29/2024)   PRAPARE - Administrator, Civil Service (Medical): No    Lack of Transportation (Non-Medical): No  Physical Activity: Unknown (02/29/2024)   Exercise Vital Sign    Days of Exercise per Week: 0 days    Minutes of Exercise per Session: Not on file  Stress: Stress Concern Present (02/29/2024)   [employer] of Occupational Health - Occupational Stress Questionnaire    Feeling of Stress : To some extent  Social Connections: Moderately Integrated (02/29/2024)   Social Connection and Isolation Panel [NHANES]    Frequency of Communication with Friends and Family: More than three times a week    Frequency of Social Gatherings with Friends and Family: Once a week    Attends Religious Services: More than 4 times per year    Active Member of [employer] or Organizations: No    Attends Banker Meetings:  Not on file    Marital Status: Married  Catering manager Violence: Not on file    Family History  Problem Relation Age of Onset   Other Neg Hx     Past Surgical History:  Procedure Laterality Date   GANGLION CYST EXCISION  1999   R wrist   TOE SURGERY      ROS: Review of Systems Negative except as stated above  PHYSICAL EXAM: BP 131/88 (BP Location: Right Arm, Patient Position: Sitting, Cuff Size: Normal)   Pulse 64   Temp (!) 97.1 F (36.2 C) (Oral)   Wt 228 lb 3.2 oz (103.5 kg)   LMP 02/06/2024   SpO2 96%   BMI 32.74 kg/m   Physical Exam HENT:     Head: Normocephalic and atraumatic.      Right Ear: Tympanic membrane, ear canal and external ear normal.     Left Ear: Tympanic membrane, ear canal and external ear normal.     Nose: Nose normal.     Mouth/Throat:     Mouth: Mucous membranes are moist.     Pharynx: Oropharynx is clear.  Eyes:     Extraocular Movements: Extraocular movements intact.     Conjunctiva/sclera: Conjunctivae normal.     Pupils: Pupils are equal, round, and reactive to light.  Cardiovascular:     Rate and Rhythm: Normal rate and regular rhythm.     Pulses: Normal pulses.     Heart sounds: Normal heart sounds.  Pulmonary:     Effort: Pulmonary effort is normal.     Breath sounds: Normal breath sounds.  Musculoskeletal:        General: Normal range of motion.     Cervical back: Normal range of motion and neck supple.  Neurological:     General: No focal deficit present.     Mental Status: She is alert and oriented to person, place, and time.  Psychiatric:        Mood and Affect: Mood normal.        Behavior: Behavior normal.     ASSESSMENT AND PLAN: 1. Upper respiratory symptom (Primary) - Patient today in office with no cardiopulmonary/acute distress.  - Amoxicillin-Clavulanate and Prednisone  as prescribed. Counseled on medication adherence/adverse effects.  - I was informed from [facility], CMA that [facility] does not have any respiratory panels.  - Follow-up with primary provider as scheduled. - amoxicillin-clavulanate (AUGMENTIN) 875-125 MG tablet; Take 1 tablet by mouth 2 (two) times daily.  Dispense: 20 tablet; Refill: 0 - predniSONE  (DELTASONE ) 10 MG tablet; Take 6 tablets (60 mg total) by mouth daily with breakfast for 1 day, THEN 5 tablets (50 mg total) daily with breakfast for 1 day, THEN 4 tablets (40 mg total) daily with breakfast for 1 day, THEN 3 tablets (30 mg total) daily with breakfast for 1 day, THEN 2 tablets (20 mg total) daily with breakfast for 1 day, THEN 1 tablet (10 mg total) daily with breakfast for 1 day.   Dispense: 21 tablet; Refill: 0 - COVID-19, Flu A+B and RSV   Patient was given the opportunity to ask questions.  Patient verbalized understanding of the plan and was able to repeat key elements of the plan. Patient was given clear instructions to go to Emergency Department or return to medical center if symptoms don't improve, worsen, or new problems develop.The patient verbalized understanding.   Orders Placed This Encounter  Procedures   COVID-19, Flu A+B and RSV     Requested  Prescriptions   Signed Prescriptions Disp Refills   amoxicillin-clavulanate (AUGMENTIN) 875-125 MG tablet 20 tablet 0    Sig: Take 1 tablet by mouth 2 (two) times daily.   predniSONE  (DELTASONE ) 10 MG tablet 21 tablet 0    Sig: Take 6 tablets (60 mg total) by mouth daily with breakfast for 1 day, THEN 5 tablets (50 mg total) daily with breakfast for 1 day, THEN 4 tablets (40 mg total) daily with breakfast for 1 day, THEN 3 tablets (30 mg total) daily with breakfast for 1 day, THEN 2 tablets (20 mg total) daily with breakfast for 1 day, THEN 1 tablet (10 mg total) daily with breakfast for 1 day.    Follow-up with primary provider as scheduled.  Senaida Dama, NP

## 2024-02-29 NOTE — Progress Notes (Signed)
 Patient left before she could be tested for COVID/FLU(A&B). Stated that she had to go to work. Provider made aware.

## 2024-06-01 ENCOUNTER — Encounter: Payer: Self-pay | Admitting: Family

## 2024-06-01 ENCOUNTER — Ambulatory Visit (INDEPENDENT_AMBULATORY_CARE_PROVIDER_SITE_OTHER): Admitting: Family

## 2024-06-01 VITALS — BP 132/86 | HR 65 | Temp 98.4°F | Resp 16 | Ht 70.0 in | Wt 225.8 lb

## 2024-06-01 DIAGNOSIS — G43909 Migraine, unspecified, not intractable, without status migrainosus: Secondary | ICD-10-CM

## 2024-06-01 DIAGNOSIS — R61 Generalized hyperhidrosis: Secondary | ICD-10-CM | POA: Diagnosis not present

## 2024-06-01 DIAGNOSIS — R232 Flushing: Secondary | ICD-10-CM

## 2024-06-01 DIAGNOSIS — R413 Other amnesia: Secondary | ICD-10-CM

## 2024-06-01 MED ORDER — SUMATRIPTAN SUCCINATE 25 MG PO TABS: ORAL_TABLET | ORAL | 1 refills | Status: AC

## 2024-06-01 NOTE — Progress Notes (Signed)
 Patient ID: Taylor May, female    DOB: 1981-09-03  MRN: [id number]  CC: Chronic Conditions Follow-Up  Subjective: Taylor May is a 43 y.o. female who presents for chronic conditions follow-up.   Her concerns today include:  - States intermittent headaches for months. Denies red flag symptoms. Reports headaches located at back of head. Taking over-the-counter medication with mild relief. States memory loss for example when reading/studying the Bible. - States hot flashes, night sweats, and skin crawling worse during nighttime. States she considered if related to hormones. Reports she has regular periods. She is established with Gynecology for women's health screenings/maintenance.    Patient Active Problem List   Diagnosis Date Noted   Anxiety and depression 02/15/2022   Donor, kidney 07/18/2019   GAD (generalized anxiety disorder) 06/30/2018     Current Outpatient Medications on File Prior to Visit  Medication Sig Dispense Refill   amoxicillin -clavulanate (AUGMENTIN ) 875-125 MG tablet Take 1 tablet by mouth 2 (two) times daily. (Patient not taking: Reported on 06/01/2024) 20 tablet 0   hydrOXYzine  (VISTARIL ) 25 MG capsule Take 1 capsule (25 mg total) by mouth every 8 (eight) hours as needed. (Patient not taking: Reported on 02/29/2024) 30 capsule 0   methocarbamol  (ROBAXIN ) 500 MG tablet Take 1-2 tablets (500-1,000 mg total) by mouth every 8 (eight) hours as needed for muscle spasms. (Patient not taking: Reported on 02/29/2024) 20 tablet 0   naproxen  (NAPROSYN ) 375 MG tablet Take 1 tablet (375 mg total) by mouth 2 (two) times daily. (Patient not taking: Reported on 02/29/2024) 20 tablet 0   sertraline  (ZOLOFT ) 25 MG tablet Take 1 tablet (25 mg total) by mouth daily. (Patient not taking: Reported on 06/01/2024) 30 tablet 0   No current facility-administered medications on file prior to visit.    Allergies  Allergen Reactions   Codeine Hives, Other (See Comments) and Itching     hallucinations hallucinations     Social History   Socioeconomic History   Marital status: Married    Spouse name: Not on file   Number of children: Not on file   Years of education: Not on file   Highest education level: Associate degree: occupational, Scientist, product/process development, or vocational program  Occupational History   Not on file  Tobacco Use   Smoking status: Never    Passive exposure: Never   Smokeless tobacco: Never  Vaping Use   Vaping status: Never Used  Substance and Sexual Activity   Alcohol use: No   Drug use: No   Sexual activity: Yes  Other Topics Concern   Not on file  Social History Narrative   Not on file   Social Drivers of Health   Financial Resource Strain: Low Risk  (05/31/2024)   Overall Financial Resource Strain (CARDIA)    Difficulty of Paying Living Expenses: Not very hard  Food Insecurity: No Food Insecurity (05/31/2024)   Hunger Vital Sign    Worried About Running Out of Food in the Last Year: Never true    Ran Out of Food in the Last Year: Never true  Transportation Needs: No Transportation Needs (05/31/2024)   PRAPARE - Administrator, Civil Service (Medical): No    Lack of Transportation (Non-Medical): No  Physical Activity: Inactive (05/31/2024)   Exercise Vital Sign    Days of Exercise per Week: 0 days    Minutes of Exercise per Session: Not on file  Stress: Stress Concern Present (05/31/2024)   [employer] of Occupational Health -  Occupational Stress Questionnaire    Feeling of Stress: To some extent  Social Connections: Socially Integrated (05/31/2024)   Social Connection and Isolation Panel    Frequency of Communication with Friends and Family: More than three times a week    Frequency of Social Gatherings with Friends and Family: Once a week    Attends Religious Services: More than 4 times per year    Active Member of [employer] or Organizations: Yes    Attends Engineer, structural: More than 4 times per year    Marital  Status: Married  Catering manager Violence: Not At Risk (06/01/2024)   Humiliation, Afraid, Rape, and Kick questionnaire    Fear of Current or Ex-Partner: No    Emotionally Abused: No    Physically Abused: No    Sexually Abused: No    Family History  Problem Relation Age of Onset   Other Neg Hx     Past Surgical History:  Procedure Laterality Date   GANGLION CYST EXCISION  1999   R wrist   TOE SURGERY      ROS: Review of Systems Negative except as stated above  PHYSICAL EXAM: BP 132/86   Pulse 65   Temp 98.4 F (36.9 C) (Oral)   Resp 16   Ht 5' 10 (1.778 m)   Wt 225 lb 12.8 oz (102.4 kg)   LMP 05/17/2024 (Exact Date)   SpO2 96%   BMI 32.40 kg/m   Physical Exam HENT:     Head: Normocephalic and atraumatic.     Right Ear: Tympanic membrane, ear canal and external ear normal.     Left Ear: Tympanic membrane, ear canal and external ear normal.     Nose: Nose normal.     Mouth/Throat:     Mouth: Mucous membranes are moist.     Pharynx: Oropharynx is clear.  Eyes:     Extraocular Movements: Extraocular movements intact.     Conjunctiva/sclera: Conjunctivae normal.     Pupils: Pupils are equal, round, and reactive to light.  Cardiovascular:     Rate and Rhythm: Normal rate and regular rhythm.     Pulses: Normal pulses.     Heart sounds: Normal heart sounds.  Pulmonary:     Effort: Pulmonary effort is normal.     Breath sounds: Normal breath sounds.  Musculoskeletal:        General: Normal range of motion.     Cervical back: Normal range of motion and neck supple.  Neurological:     General: No focal deficit present.     Mental Status: She is alert and oriented to person, place, and time.  Psychiatric:        Mood and Affect: Mood normal.        Behavior: Behavior normal.     ASSESSMENT AND PLAN: 1. Migraine without status migrainosus, not intractable, unspecified migraine type (Primary) 2. Memory loss - Sumatriptan  as prescribed. Counseled on  medication adherence/adverse effects.  - Routine screening.  - Referral to Neurology for evaluation/management. - SUMAtriptan  (IMITREX ) 25 MG tablet; Take 25 mg (1 tablet total) by mouth at the start of the headache. May repeat in 2 hours x 1 if headache persists. Max of 2 tablets/24 hours.  Dispense: 90 tablet; Refill: 1 - Ambulatory referral to Neurology - Basic Metabolic Panel  3. Hot flashes 4. Night sweats - Patient declined pharmacological therapy. - Routine screening.  - Keep all scheduled appointments with established Gynecology. - Follicle stimulating hormone - Luteinizing  hormone   Patient was given the opportunity to ask questions.  Patient verbalized understanding of the plan and was able to repeat key elements of the plan. Patient was given clear instructions to go to Emergency Department or return to medical center if symptoms don't improve, worsen, or new problems develop.The patient verbalized understanding.   Orders Placed This Encounter  Procedures   Follicle stimulating hormone   Luteinizing hormone   Basic Metabolic Panel   Ambulatory referral to Neurology     Requested Prescriptions   Signed Prescriptions Disp Refills   SUMAtriptan  (IMITREX ) 25 MG tablet 90 tablet 1    Sig: Take 25 mg (1 tablet total) by mouth at the start of the headache. May repeat in 2 hours x 1 if headache persists. Max of 2 tablets/24 hours.    Return for Physical per patient preference.  Greig JINNY Drones, NP

## 2024-06-01 NOTE — Progress Notes (Signed)
 Headaches, hot flashes, memory loss, night sweats

## 2024-06-02 LAB — BASIC METABOLIC PANEL WITH GFR
BUN/Creatinine Ratio: 11 (ref 9–23)
BUN: 12 mg/dL (ref 6–24)
CO2: 22 mmol/L (ref 20–29)
Calcium: 9.7 mg/dL (ref 8.7–10.2)
Chloride: 103 mmol/L (ref 96–106)
Creatinine, Ser: 1.09 mg/dL — ABNORMAL HIGH (ref 0.57–1.00)
Glucose: 83 mg/dL (ref 70–99)
Potassium: 4.3 mmol/L (ref 3.5–5.2)
Sodium: 138 mmol/L (ref 134–144)
eGFR: 64 mL/min/1.73 (ref >59)

## 2024-06-02 LAB — LUTEINIZING HORMONE: LH: 39.9 m[IU]/mL

## 2024-06-02 LAB — FOLLICLE STIMULATING HORMONE: FSH: 17.7 m[IU]/mL

## 2024-06-05 ENCOUNTER — Ambulatory Visit: Payer: Self-pay | Admitting: Family

## 2024-07-03 ENCOUNTER — Encounter: Payer: Self-pay | Admitting: Neurology

## 2024-10-16 ENCOUNTER — Ambulatory Visit (INDEPENDENT_AMBULATORY_CARE_PROVIDER_SITE_OTHER): Admitting: Family

## 2024-10-16 VITALS — BP 150/100 | HR 67 | Temp 98.5°F | Resp 16 | Ht 70.0 in | Wt 227.6 lb

## 2024-10-16 DIAGNOSIS — Z0000 Encounter for general adult medical examination without abnormal findings: Secondary | ICD-10-CM

## 2024-10-16 DIAGNOSIS — Z5320 Procedure and treatment not carried out because of patient's decision for unspecified reasons: Secondary | ICD-10-CM

## 2024-10-16 DIAGNOSIS — Z13228 Encounter for screening for other metabolic disorders: Secondary | ICD-10-CM

## 2024-10-16 DIAGNOSIS — Z131 Encounter for screening for diabetes mellitus: Secondary | ICD-10-CM

## 2024-10-16 DIAGNOSIS — Z13220 Encounter for screening for lipoid disorders: Secondary | ICD-10-CM | POA: Diagnosis not present

## 2024-10-16 DIAGNOSIS — Z1329 Encounter for screening for other suspected endocrine disorder: Secondary | ICD-10-CM

## 2024-10-16 DIAGNOSIS — Z130 Encounter for screening for diseases of the blood and blood-forming organs and certain disorders involving the immune mechanism: Secondary | ICD-10-CM | POA: Diagnosis not present

## 2024-10-16 DIAGNOSIS — R030 Elevated blood-pressure reading, without diagnosis of hypertension: Secondary | ICD-10-CM

## 2024-10-16 NOTE — Progress Notes (Signed)
 "   Patient ID: Taylor May, female    DOB: 12-Feb-1981  MRN: [id number]  CC: Annual Exam  Subjective: Taylor May is a 44 y.o. female who presents for annual exam.   Her concerns today include:  - Patient states up to date on mammogram. - Patient states up to date on cervical cancer screening.  - Blood pressure check. Patient states she was at the [facility] earlier today and became upset. She does not complain of red flag symptoms such as but not limited to chest pain, shortness of breath, worst headache of life, nausea/vomiting.   Patient Active Problem List   Diagnosis Date Noted   Anxiety and depression 02/15/2022   Donor, kidney 07/18/2019   GAD (generalized anxiety disorder) 06/30/2018     Medications Ordered Prior to Encounter[1]  Allergies[2]  Social History   Socioeconomic History   Marital status: Married    Spouse name: Not on file   Number of children: Not on file   Years of education: Not on file   Highest education level: Some college, no degree  Occupational History   Not on file  Tobacco Use   Smoking status: Never    Passive exposure: Never   Smokeless tobacco: Never  Vaping Use   Vaping status: Never Used  Substance and Sexual Activity   Alcohol use: No   Drug use: No   Sexual activity: Yes  Other Topics Concern   Not on file  Social History Narrative   Not on file   Social Drivers of Health   Tobacco Use: Low Risk (10/16/2024)   Patient History    Smoking Tobacco Use: Never    Smokeless Tobacco Use: Never    Passive Exposure: Never  Financial Resource Strain: Low Risk (10/16/2024)   Overall Financial Resource Strain (CARDIA)    Difficulty of Paying Living Expenses: Not very hard  Food Insecurity: No Food Insecurity (10/16/2024)   Epic    Worried About Radiation Protection Practitioner of Food in the Last Year: Never true    Ran Out of Food in the Last Year: Never true  Transportation Needs: No Transportation Needs (10/16/2024)   Epic    Lack of Transportation  (Medical): No    Lack of Transportation (Non-Medical): No  Physical Activity: Inactive (10/16/2024)   Exercise Vital Sign    Days of Exercise per Week: 0 days    Minutes of Exercise per Session: Not on file  Stress: No Stress Concern Present (10/16/2024)   [employer] of Occupational Health - Occupational Stress Questionnaire    Feeling of Stress: Only a little  Social Connections: Socially Integrated (10/16/2024)   Social Connection and Isolation Panel    Frequency of Communication with Friends and Family: More than three times a week    Frequency of Social Gatherings with Friends and Family: Once a week    Attends Religious Services: More than 4 times per year    Active Member of Clubs or Organizations: Yes    Attends Banker Meetings: More than 4 times per year    Marital Status: Married  Catering Manager Violence: Not At Risk (06/01/2024)   Epic    Fear of Current or Ex-Partner: No    Emotionally Abused: No    Physically Abused: No    Sexually Abused: No  Depression (PHQ2-9): Low Risk (10/16/2024)   Depression (PHQ2-9)    PHQ-2 Score: 0  Alcohol Screen: Low Risk (05/31/2024)   Alcohol Screen    Last Alcohol Screening  Score (AUDIT): 0  Housing: Low Risk (10/16/2024)   Epic    Unable to Pay for Housing in the Last Year: No    Number of Times Moved in the Last Year: 0    Homeless in the Last Year: No  Utilities: Not At Risk (06/01/2024)   Epic    Threatened with loss of utilities: No  Health Literacy: Adequate Health Literacy (06/01/2024)   B1300 Health Literacy    Frequency of need for help with medical instructions: Never    Family History  Problem Relation Age of Onset   Other Neg Hx     Past Surgical History:  Procedure Laterality Date   GANGLION CYST EXCISION  1999   R wrist   TOE SURGERY      ROS: Review of Systems Negative except as stated above  PHYSICAL EXAM: BP (!) 150/100   Pulse 67   Temp 98.5 F (36.9 C) (Oral)   Resp 16   Ht 5' 10  (1.778 m)   Wt 227 lb 9.6 oz (103.2 kg)   LMP 10/11/2024 (Exact Date)   SpO2 97%   BMI 32.66 kg/m   Physical Exam HENT:     Head: Normocephalic and atraumatic.     Right Ear: Tympanic membrane, ear canal and external ear normal.     Left Ear: Tympanic membrane, ear canal and external ear normal.     Nose: Nose normal.     Mouth/Throat:     Mouth: Mucous membranes are moist.     Pharynx: Oropharynx is clear.  Eyes:     Extraocular Movements: Extraocular movements intact.     Conjunctiva/sclera: Conjunctivae normal.     Pupils: Pupils are equal, round, and reactive to light.  Neck:     Thyroid : No thyroid  mass, thyromegaly or thyroid  tenderness.  Cardiovascular:     Rate and Rhythm: Normal rate and regular rhythm.     Pulses: Normal pulses.     Heart sounds: Normal heart sounds.  Pulmonary:     Effort: Pulmonary effort is normal.     Breath sounds: Normal breath sounds.  Chest:     Comments: Patient declined.  Abdominal:     General: Bowel sounds are normal.     Palpations: Abdomen is soft.  Genitourinary:    Comments: Patient declined. Musculoskeletal:        General: Normal range of motion.     Right shoulder: Normal.     Left shoulder: Normal.     Right upper arm: Normal.     Left upper arm: Normal.     Right elbow: Normal.     Left elbow: Normal.     Right forearm: Normal.     Left forearm: Normal.     Right wrist: Normal.     Left wrist: Normal.     Right hand: Normal.     Left hand: Normal.     Cervical back: Normal, normal range of motion and neck supple.     Thoracic back: Normal.     Lumbar back: Normal.     Right hip: Normal.     Left hip: Normal.     Right upper leg: Normal.     Left upper leg: Normal.     Right knee: Normal.     Left knee: Normal.     Right lower leg: Normal.     Left lower leg: Normal.     Right ankle: Normal.     Left ankle: Normal.  Right foot: Normal.     Left foot: Normal.  Skin:    General: Skin is warm and dry.      Capillary Refill: Capillary refill takes less than 2 seconds.  Neurological:     General: No focal deficit present.     Mental Status: She is alert and oriented to person, place, and time.  Psychiatric:        Mood and Affect: Mood normal.        Behavior: Behavior normal.     ASSESSMENT AND PLAN: 1. Annual physical exam (Primary) - Counseled on 150 minutes of exercise per week as tolerated, healthy eating (including decreased daily intake of saturated fats, cholesterol, added sugars, sodium), STI prevention, and routine healthcare maintenance.  2. Screening for metabolic disorder - Routine screening.  - CMP14+EGFR  3. Screening for deficiency anemia - Routine screening.  - CBC  4. Diabetes mellitus screening - Routine screening.  - Hemoglobin A1c  5. Screening cholesterol level - Routine screening.  - Lipid panel  6. Thyroid  disorder screen - Routine screening.  - TSH  7. Mammogram declined - Patient declined.   8. Cervical cancer screening declined - Patient declined.   9. Elevated blood pressure reading - Blood pressure not at goal during today's visit. Patient asymptomatic without chest pressure, chest pain, palpitations, shortness of breath, worst headache of life, and any additional red flag symptoms. - Follow-up with primary provider in 4 weeks or sooner if needed.   Patient was given the opportunity to ask questions.  Patient verbalized understanding of the plan and was able to repeat key elements of the plan. Patient was given clear instructions to go to Emergency Department or return to medical center if symptoms don't improve, worsen, or new problems develop.The patient verbalized understanding.   Orders Placed This Encounter  Procedures   CBC   Lipid panel   CMP14+EGFR   Hemoglobin A1c   TSH    Return in about 1 year (around 10/16/2025) for Physical per patient preference.  Greig JINNY Chute, NP      [1]  Current Outpatient Medications on File  Prior to Visit  Medication Sig Dispense Refill   amoxicillin -clavulanate (AUGMENTIN ) 875-125 MG tablet Take 1 tablet by mouth 2 (two) times daily. (Patient not taking: Reported on 06/01/2024) 20 tablet 0   hydrOXYzine  (VISTARIL ) 25 MG capsule Take 1 capsule (25 mg total) by mouth every 8 (eight) hours as needed. (Patient not taking: Reported on 02/29/2024) 30 capsule 0   methocarbamol  (ROBAXIN ) 500 MG tablet Take 1-2 tablets (500-1,000 mg total) by mouth every 8 (eight) hours as needed for muscle spasms. (Patient not taking: Reported on 02/29/2024) 20 tablet 0   naproxen  (NAPROSYN ) 375 MG tablet Take 1 tablet (375 mg total) by mouth 2 (two) times daily. (Patient not taking: Reported on 02/29/2024) 20 tablet 0   sertraline  (ZOLOFT ) 25 MG tablet Take 1 tablet (25 mg total) by mouth daily. (Patient not taking: Reported on 06/01/2024) 30 tablet 0   SUMAtriptan  (IMITREX ) 25 MG tablet Take 25 mg (1 tablet total) by mouth at the start of the headache. May repeat in 2 hours x 1 if headache persists. Max of 2 tablets/24 hours. (Patient not taking: Reported on 10/16/2024) 90 tablet 1   No current facility-administered medications on file prior to visit.  [2]  Allergies Allergen Reactions   Codeine Hives, Other (See Comments) and Itching    hallucinations hallucinations    "

## 2024-10-17 ENCOUNTER — Ambulatory Visit: Payer: Self-pay | Admitting: Family

## 2024-10-17 DIAGNOSIS — Z13228 Encounter for screening for other metabolic disorders: Secondary | ICD-10-CM

## 2024-10-17 LAB — CMP14+EGFR
ALT: 9 IU/L (ref 0–32)
AST: 16 IU/L (ref 0–40)
Albumin: 4.1 g/dL (ref 3.9–4.9)
Alkaline Phosphatase: 66 IU/L (ref 41–116)
BUN/Creatinine Ratio: 11 (ref 9–23)
BUN: 14 mg/dL (ref 6–24)
Bilirubin Total: 0.6 mg/dL (ref 0.0–1.2)
CO2: 22 mmol/L (ref 20–29)
Calcium: 9.3 mg/dL (ref 8.7–10.2)
Chloride: 102 mmol/L (ref 96–106)
Creatinine, Ser: 1.23 mg/dL — ABNORMAL HIGH (ref 0.57–1.00)
Globulin, Total: 3.2 g/dL (ref 1.5–4.5)
Glucose: 102 mg/dL — ABNORMAL HIGH (ref 70–99)
Potassium: 3.5 mmol/L (ref 3.5–5.2)
Sodium: 136 mmol/L (ref 134–144)
Total Protein: 7.3 g/dL (ref 6.0–8.5)
eGFR: 56 mL/min/1.73 — ABNORMAL LOW

## 2024-10-17 LAB — CBC
Hematocrit: 36.3 % (ref 34.0–46.6)
Hemoglobin: 11.4 g/dL (ref 11.1–15.9)
MCH: 27 pg (ref 26.6–33.0)
MCHC: 31.4 g/dL — ABNORMAL LOW (ref 31.5–35.7)
MCV: 86 fL (ref 79–97)
Platelets: 237 x10E3/uL (ref 150–450)
RBC: 4.22 x10E6/uL (ref 3.77–5.28)
RDW: 13.9 % (ref 11.7–15.4)
WBC: 6.4 x10E3/uL (ref 3.4–10.8)

## 2024-10-17 LAB — HEMOGLOBIN A1C
Est. average glucose Bld gHb Est-mCnc: 120 mg/dL
Hgb A1c MFr Bld: 5.8 % — ABNORMAL HIGH (ref 4.8–5.6)

## 2024-10-17 LAB — TSH: TSH: 1.84 u[IU]/mL (ref 0.450–4.500)

## 2024-10-17 LAB — LIPID PANEL
Chol/HDL Ratio: 3.7 ratio (ref 0.0–4.4)
Cholesterol, Total: 147 mg/dL (ref 100–199)
HDL: 40 mg/dL
LDL Chol Calc (NIH): 84 mg/dL (ref 0–99)
Triglycerides: 128 mg/dL (ref 0–149)
VLDL Cholesterol Cal: 23 mg/dL (ref 5–40)

## 2024-10-22 NOTE — Telephone Encounter (Signed)
 Report to the Emergency Department/Urgent Care/call 911 for immediate medical evaluation. Follow-up with Primary Care.

## 2024-10-25 ENCOUNTER — Emergency Department (HOSPITAL_BASED_OUTPATIENT_CLINIC_OR_DEPARTMENT_OTHER): Admitting: Radiology

## 2024-10-25 ENCOUNTER — Other Ambulatory Visit: Payer: Self-pay

## 2024-10-25 ENCOUNTER — Emergency Department (HOSPITAL_BASED_OUTPATIENT_CLINIC_OR_DEPARTMENT_OTHER)
Admission: EM | Admit: 2024-10-25 | Discharge: 2024-10-26 | Disposition: A | Discharge status: Home / Self Care | Attending: Emergency Medicine | Admitting: Emergency Medicine

## 2024-10-25 DIAGNOSIS — R0789 Other chest pain: Secondary | ICD-10-CM | POA: Insufficient documentation

## 2024-10-25 DIAGNOSIS — R079 Chest pain, unspecified: Secondary | ICD-10-CM

## 2024-10-25 LAB — CBC WITH DIFFERENTIAL/PLATELET
Abs Immature Granulocytes: 0.01 K/uL (ref 0.00–0.07)
Basophils Absolute: 0 K/uL (ref 0.0–0.1)
Basophils Relative: 1 %
Eosinophils Absolute: 0.4 K/uL (ref 0.0–0.5)
Eosinophils Relative: 4 %
HCT: 32.9 % — ABNORMAL LOW (ref 36.0–46.0)
Hemoglobin: 11 g/dL — ABNORMAL LOW (ref 12.0–15.0)
Immature Granulocytes: 0 %
Lymphocytes Relative: 29 %
Lymphs Abs: 2.5 K/uL (ref 0.7–4.0)
MCH: 27.4 pg (ref 26.0–34.0)
MCHC: 33.4 g/dL (ref 30.0–36.0)
MCV: 82 fL (ref 80.0–100.0)
Monocytes Absolute: 0.6 K/uL (ref 0.1–1.0)
Monocytes Relative: 7 %
Neutro Abs: 4.9 K/uL (ref 1.7–7.7)
Neutrophils Relative %: 59 %
Platelets: 243 K/uL (ref 150–400)
RBC: 4.01 MIL/uL (ref 3.87–5.11)
RDW: 14 % (ref 11.5–15.5)
WBC: 8.4 K/uL (ref 4.0–10.5)
nRBC: 0 % (ref 0.0–0.2)

## 2024-10-25 LAB — COMPREHENSIVE METABOLIC PANEL WITH GFR
ALT: 11 U/L (ref 0–44)
AST: 18 U/L (ref 15–41)
Albumin: 4 g/dL (ref 3.5–5.0)
Alkaline Phosphatase: 77 U/L (ref 38–126)
Anion gap: 11 (ref 5–15)
BUN: 12 mg/dL (ref 6–20)
CO2: 25 mmol/L (ref 22–32)
Calcium: 9.4 mg/dL (ref 8.9–10.3)
Chloride: 105 mmol/L (ref 98–111)
Creatinine, Ser: 1.16 mg/dL — ABNORMAL HIGH (ref 0.44–1.00)
GFR, Estimated: 59 mL/min — ABNORMAL LOW
Glucose, Bld: 130 mg/dL — ABNORMAL HIGH (ref 70–99)
Potassium: 3.9 mmol/L (ref 3.5–5.1)
Sodium: 140 mmol/L (ref 135–145)
Total Bilirubin: 0.2 mg/dL (ref 0.0–1.2)
Total Protein: 7.1 g/dL (ref 6.5–8.1)

## 2024-10-25 LAB — TROPONIN T, HIGH SENSITIVITY: Troponin T High Sensitivity: <15 ng/L (ref 0–19)

## 2024-10-25 NOTE — ED Provider Notes (Signed)
 " [place] [facility] AT [facility] Provider Note   CSN: [id number] Arrival date & time: 10/25/24  2223     Patient presents with: Chest Pain   Taylor May is a 44 y.o. female.   Pt complains of pain in her chest.  Pt reports she saw her Md last week and her blood pressure was elevated.  Pt reports her blood pressure was elevated this week.  Pt states she has had left sided chest pressure for 4 days.    The history is provided by the patient. No language interpreter was used.  Chest Pain Pain location:  L chest Pain quality: aching   Pain radiates to:  Does not radiate Pain severity:  Moderate Duration:  4 days Timing:  Constant Chronicity:  New Context: movement   Worsened by:  Nothing Ineffective treatments:  None tried      Prior to Admission medications  Medication Sig Start Date End Date Taking? Authorizing Provider  amoxicillin -clavulanate (AUGMENTIN ) 875-125 MG tablet Take 1 tablet by mouth 2 (two) times daily. Patient not taking: Reported on 06/01/2024 02/29/24   Jaycee Greig JINNY, NP  hydrOXYzine  (VISTARIL ) 25 MG capsule Take 1 capsule (25 mg total) by mouth every 8 (eight) hours as needed. Patient not taking: Reported on 02/29/2024 09/24/22   Jaycee Greig JINNY, NP  methocarbamol  (ROBAXIN ) 500 MG tablet Take 1-2 tablets (500-1,000 mg total) by mouth every 8 (eight) hours as needed for muscle spasms. Patient not taking: Reported on 02/29/2024 11/26/23   Trine Raynell Moder, MD  naproxen  (NAPROSYN ) 375 MG tablet Take 1 tablet (375 mg total) by mouth 2 (two) times daily. Patient not taking: Reported on 02/29/2024 11/26/23   Trine Raynell Moder, MD  sertraline  (ZOLOFT ) 25 MG tablet Take 1 tablet (25 mg total) by mouth daily. Patient not taking: Reported on 06/01/2024 09/24/22 10/24/22  Jaycee Greig JINNY, NP  SUMAtriptan  (IMITREX ) 25 MG tablet Take 25 mg (1 tablet total) by mouth at the start of the headache. May repeat in 2 hours x 1 if headache persists. Max  of 2 tablets/24 hours. Patient not taking: Reported on 10/16/2024 06/01/24   Jaycee Greig JINNY, NP    Allergies: Codeine    Review of Systems  Cardiovascular:  Positive for chest pain.  All other systems reviewed and are negative.   Updated Vital Signs BP (!) 174/104   Pulse 74   Temp 98 F (36.7 C) (Oral)   Resp 15   Wt 103 kg   LMP 10/11/2024 (Exact Date)   SpO2 100%   BMI 32.57 kg/m   Physical Exam Vitals and nursing note reviewed.  Constitutional:      Appearance: She is well-developed.  HENT:     Head: Normocephalic.  Cardiovascular:     Rate and Rhythm: Normal rate and regular rhythm.     Heart sounds: Normal heart sounds.  Pulmonary:     Effort: Pulmonary effort is normal.     Breath sounds: Normal breath sounds.  Abdominal:     General: There is no distension.     Palpations: Abdomen is soft.  Musculoskeletal:        General: Normal range of motion.     Cervical back: Normal range of motion.  Skin:    General: Skin is warm.  Neurological:     General: No focal deficit present.     Mental Status: She is alert and oriented to person, place, and time.     (all labs ordered  are listed, but only abnormal results are displayed) Labs Reviewed - No data to display  EKG: EKG Interpretation Date/Time:  Thursday October 25 2024 22:35:13 EST Ventricular Rate:  64 PR Interval:  141 QRS Duration:  86 QT Interval:  383 QTC Calculation: 396 R Axis:   88  Text Interpretation: Sinus rhythm Borderline T abnormalities, diffuse leads No significant change since last tracing Confirmed by Ruthe Cornet [phone]) on 10/25/2024 10:40:33 PM  Radiology: No results found.   Procedures   Medications Ordered in the ED - No data to display  Clinical Course as of 10/25/24 2300  Thu Oct 25, 2024  2257 Stable 72 YOF with a chief complaint of chest pain GFR down on screening labs MSK chest pain [CC]    Clinical Course User Index [CC] Jerral Meth, MD                                  Medical Decision Making Pt complains of left sided chest pain that started 4 days ago.  Pt reports her blood pressure has been elevated   Amount and/or Complexity of Data Reviewed Independent Historian: parent    Details: Pt complains of left sided chest pain  Labs: ordered. Decision-making details documented in ED Course.    Details: Labs ordered reviewed and interpreted  Radiology: ordered. ECG/medicine tests: ordered and independent interpretation performed. Decision-making details documented in ED Course.    Details: EKG normal sinus  no acute abnormality        Final diagnoses:  Chest pain, unspecified type    ED Discharge Orders     None      Pt's care turned over to Dr. Jerral at 11:pm.    Flint Sonny POUR, PA-C 10/25/24 2303    Jerral Meth, MD 10/26/24 0001    Jerral Meth, MD 10/26/24 0002  "

## 2024-10-25 NOTE — ED Triage Notes (Signed)
 Pt arrives with c/o CP and headache that started a few days ago. Pt reports pain radiates into her neck and left arm. Pt reports lightheadedness 2 days ago.

## 2024-10-30 ENCOUNTER — Ambulatory Visit: Admitting: Neurology

## 2024-11-01 ENCOUNTER — Ambulatory Visit (INDEPENDENT_AMBULATORY_CARE_PROVIDER_SITE_OTHER): Payer: Self-pay

## 2024-11-01 VITALS — BP 133/88 | HR 61 | Temp 98.8°F | Resp 16 | Ht 69.0 in | Wt 227.0 lb

## 2024-11-01 DIAGNOSIS — R030 Elevated blood-pressure reading, without diagnosis of hypertension: Secondary | ICD-10-CM | POA: Diagnosis not present

## 2024-11-01 NOTE — Progress Notes (Signed)
" ° ° ° °  Patient ID: Taylor May, female    DOB: [birth year]  MRN: [id number]  CC: Medical Management of Chronic Issues (Follow-up from ER visit)   Subjective: Taylor May is a 44 y.o. female who presents to clinic for follow-up after recent ER visit.  Patient reports feeling well overall, no longer experiencing chest discomfort, headache. Reports she has made changes to her diet including eating more fruits and vegetables and drinking more water.  Patient reports that she has started walking for exercise.   Allergies[1]  ROS: Review of Systems Negative except as stated above  PHYSICAL EXAM: BP 133/88   Pulse 61   Temp 98.8 F (37.1 C) (Oral)   Resp 16   Ht 5' 9 (1.753 m)   Wt 227 lb (103 kg)   LMP 10/11/2024 (Exact Date)   SpO2 97%   BMI 33.52 kg/m   Physical Exam  General: well-appearing, no acute distress Skin: no jaundice, rashes, or lesions Cardiovascular: regular heart rate and rhythm, normal S1/S2, no murmurs, gallops, or rubs Chest: lungs clear to auscultation bilaterally, equal breath sounds bilaterally Musculoskeletal: normal gait Extremities: no peripheral edema  ASSESSMENT AND PLAN:  1. Elevated blood pressure reading (Primary) - Lengthy discussion regarding lifestyle changes to help lower blood pressure including eating a low-sodium diet and avoiding sugary drinks.  Recommended to increase physical activity to include at least 250 minutes of moderate intensity exercise per week. - Per patient preference she would like to try lifestyle changes before starting medication for blood pressure which I agree is appropriate given her blood pressure reading in office.  - Instructed to follow-up with PCP in 3 months to assess efficacy of lifestyle changes and lowering blood pressure.   Patient was given the opportunity to ask questions.  Patient verbalized understanding of the plan and was able to repeat key elements of the plan.    No orders of the defined types  were placed in this encounter.    Requested Prescriptions    No prescriptions requested or ordered in this encounter    Return in about 3 months (around 01/30/2025) for follow-up on blood pressure with PCP Amy.  Sula Cower Itzamar Traynor, PA-C      [1]  Allergies Allergen Reactions   Codeine Hives, Other (See Comments) and Itching    hallucinations hallucinations    "

## 2025-01-30 ENCOUNTER — Ambulatory Visit: Payer: Self-pay | Admitting: Family

## 2025-10-21 ENCOUNTER — Encounter: Payer: Self-pay | Admitting: Family
